# Patient Record
Sex: Female | Born: 1985
Health system: Southern US, Community
[De-identification: ages and names within clinical notes are randomized; demographics above are authoritative.]

## PROBLEM LIST (undated history)

## (undated) DIAGNOSIS — Z9889 Other specified postprocedural states: Secondary | ICD-10-CM

## (undated) DIAGNOSIS — R002 Palpitations: Secondary | ICD-10-CM

## (undated) DIAGNOSIS — Z8751 Personal history of pre-term labor: Secondary | ICD-10-CM

## (undated) DIAGNOSIS — Z87898 Personal history of other specified conditions: Secondary | ICD-10-CM

## (undated) DIAGNOSIS — F909 Attention-deficit hyperactivity disorder, unspecified type: Secondary | ICD-10-CM

## (undated) DIAGNOSIS — R112 Nausea with vomiting, unspecified: Secondary | ICD-10-CM

## (undated) HISTORY — PX: CERVICAL BIOPSY  W/ LOOP ELECTRODE EXCISION: SUR135

## (undated) HISTORY — DX: Palpitations: R00.2

## (undated) HISTORY — PX: MYOMECTOMY: SHX85

## (undated) HISTORY — PX: OTHER SURGICAL HISTORY: SHX169

## (undated) HISTORY — DX: Attention-deficit hyperactivity disorder, unspecified type: F90.9

## (undated) HISTORY — PX: TONSILLECTOMY AND ADENOIDECTOMY: SUR1326

---
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
## 2007-09-21 HISTORY — PX: BREAST ENHANCEMENT SURGERY: SHX7

## 2014-01-15 ENCOUNTER — Ambulatory Visit (INDEPENDENT_AMBULATORY_CARE_PROVIDER_SITE_OTHER): Payer: BC Managed Care – PPO | Admitting: Cardiology

## 2014-01-15 ENCOUNTER — Encounter: Payer: Self-pay | Admitting: *Deleted

## 2014-01-15 VITALS — BP 134/72 | HR 80 | Ht 65.0 in

## 2014-01-15 DIAGNOSIS — F909 Attention-deficit hyperactivity disorder, unspecified type: Secondary | ICD-10-CM | POA: Insufficient documentation

## 2014-01-15 DIAGNOSIS — F908 Attention-deficit hyperactivity disorder, other type: Secondary | ICD-10-CM

## 2014-01-15 DIAGNOSIS — I471 Supraventricular tachycardia: Secondary | ICD-10-CM

## 2014-01-15 DIAGNOSIS — R000 Tachycardia, unspecified: Secondary | ICD-10-CM | POA: Insufficient documentation

## 2014-01-15 DIAGNOSIS — I493 Ventricular premature depolarization: Secondary | ICD-10-CM

## 2014-01-15 NOTE — Patient Instructions (Signed)
Your physician recommends that you continue on your current medications as directed. Please refer to the Current Medication list given to you today.   Your physician has requested that you have an echocardiogram. Echocardiography is a painless test that uses sound waves to create images of your heart. It provides your doctor with information about the size and shape of your heart and how well your heart's chambers and valves are working. This procedure takes approximately one hour. There are no restrictions for this procedure.    Your physician has recommended that you wear a 24 hour holter monitor. Holter monitors are medical devices that record the heart's electrical activity. Doctors most often use these monitors to diagnose arrhythmias. Arrhythmias are problems with the speed or rhythm of the heartbeat. The monitor is a small, portable device. You can wear one while you do your normal daily activities. This is usually used to diagnose what is causing palpitations/syncope (passing out).  Your physician recommends that you schedule a follow-up appointment in: 2 months with Dr Meda Coffee

## 2014-01-15 NOTE — Progress Notes (Signed)
Patient ID: Heather Herrera, female   DOB: [birth year], 28 y.o.   MRN: [id number]     Patient Name: Heather Herrera Date of Encounter: 01/15/2014  Primary Care Provider:  No primary provider on file. Primary Cardiologist:  Dorothy Spark   Problem List   Past Medical History  Diagnosis Date  . ADHD (attention deficit hyperactivity disorder)   . Palpitations    No past surgical history on file.  Allergies  Allergies  Allergen Reactions  . Azithromycin Hives  . 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 The patient states that she actually never felt the palpitation and this was an incidental finding. Her only diagnosis is ADHD for which she was started on Adderall at the beginning of school. She denies any chest pain dyspnea on exertion. She currently doesn't exercise much but he used hold out more in the past and never felt limited by shortness of breath. She denies lower extremity edema or claudication. She has occasional lower extremity cramping. She is no family history of sudden cardiac death. Her maternal grandfather had myocardial infarction in his 51s and died of congestive heart failure in his 47s. She has never smoked and doesn't have any children. No history of syncope other when her blood was drawn.  Home Medications  Prior to Admission medications   Medication Sig Start Date End Date Taking? Authorizing Provider  amphetamine-dextroamphetamine (ADDERALL XR) 25 MG 24 hr capsule Take 25 mg by mouth every morning.   Yes Historical Provider, MD  amphetamine-dextroamphetamine (ADDERALL) 10 MG tablet Take 10 mg by mouth daily as needed.   Yes Historical Provider, MD  clindamycin-tretinoin Pershing Proud) gel Apply topically at bedtime as needed.   Yes Historical Provider, MD  Multiple Vitamins tablet Take 1  tablet by mouth daily.   Yes Historical Provider, MD  Norethindrone-Ethinyl Estradiol-Fe Biphas (LO LOESTRIN FE) 1 MG-10 MCG / 10 MCG tablet Take 1 tablet by mouth daily.   Yes Historical Provider, MD    Family History  Family History  Problem Relation Age of Onset  . Heart attack Neg Hx   . Sudden death Neg Hx     Social History  History   Social History  . Marital Status: Married    Spouse Name: N/A    Number of Children: N/A  . Years of Education: N/A   Occupational History  . Not on file.   Social History Main Topics  . Smoking status: Never Smoker   . Smokeless tobacco: Not on file  . Alcohol Use: No  . Drug Use: No  . Sexual Activity: Not on file   Other Topics Concern  . Not on file   Social History Narrative  . No narrative on file     Review of Systems, as per HPI, otherwise negative General:  No chills, fever, night sweats or weight changes.  Cardiovascular:  No chest pain, dyspnea on exertion, edema, orthopnea, palpitations, paroxysmal nocturnal dyspnea. Dermatological: No rash, lesions/masses Respiratory: No cough, dyspnea Urologic: No hematuria, dysuria Abdominal:   No nausea, vomiting, diarrhea, bright red blood per rectum, melena, or hematemesis Neurologic:  No visual changes, wkns, changes in mental status. All other systems reviewed and are otherwise negative except as noted above.  Physical Exam  Blood pressure 134/72, pulse 80, height 5\' 5"  (1.651 m).  General: Pleasant, NAD Psych: Normal affect. Neuro: Alert and oriented X 3. Moves all extremities spontaneously. HEENT: Normal  Neck: Supple without bruits or JVD. Lungs:  Resp regular and unlabored, CTA. Heart: RRR no s3, s4, or murmurs. Abdomen: Soft, non-tender, non-distended, BS + x 4.  Extremities: No clubbing, cyanosis or edema. DP/PT/Radials 2+ and equal bilaterally.  Labs:  No results found for this basename: CKTOTAL, CKMB, TROPONINI,  in the last 72 hours No results found for  this basename: WBC, HGB, HCT, MCV, PLT    No results found for this basename: DDIMER   No components found with this basename: POCBNP,  No results found for this basename: na, k, cl, co2, glucose, bun, creatinine, calcium, prot, albumin, ast, alt, alkphos, bilitot, gfrnonaa, gfraa   No results found for this basename: CHOL, HDL, LDLCALC, TRIG    Accessory Clinical Findings  Echocardiogram- none  ECG - sinus tachycardia, PR interval 110 ms, PVCs in a pattern of quadrigeminy.    Assessment & Plan  28 year old female  1. Persistent sinus tachycardia and frequent PVCs in a pattern of quadrigeminal mania. Posterior 24-hour Holter monitor to evaluate for heart rate variability during the day. We will also be able to see there are any other arrhythmia such as nonsustained VT's and burden of PVCs. Ordered echocardiogram to rule out any underlying structural heart disease. Patient had recent lab drawn at her primary care physician office and they were all normal including TSH. This is possibly secondary to symptomimetic - Adderall, the patient states that if necessary she will be willing to try to taper down the dose of Adderall. We will follow once we have the results of echocardiogram and Holter monitoring.  Follow-up in 1 months.  Dorothy Spark, MD, [facility] 01/15/2014, 1:08 PM

## 2014-01-29 ENCOUNTER — Encounter: Payer: Self-pay | Admitting: *Deleted

## 2014-01-29 ENCOUNTER — Other Ambulatory Visit (HOSPITAL_COMMUNITY): Payer: BC Managed Care – PPO

## 2014-01-29 NOTE — Progress Notes (Signed)
Patient ID: Heather Herrera, female   DOB: Jan 08, 1986, 28 y.o.   MRN: [id number] Patient did not show up for 01/29/2014 8:30 AM appointment to have a 24 hour holter monitor applied.

## 2014-01-30 ENCOUNTER — Encounter (HOSPITAL_COMMUNITY): Payer: Self-pay | Admitting: Cardiology

## 2014-02-05 ENCOUNTER — Other Ambulatory Visit (HOSPITAL_COMMUNITY): Payer: BC Managed Care – PPO

## 2014-02-27 ENCOUNTER — Ambulatory Visit (HOSPITAL_COMMUNITY): Payer: BC Managed Care – PPO | Attending: Cardiology | Admitting: Radiology

## 2014-02-27 ENCOUNTER — Encounter: Payer: Self-pay | Admitting: *Deleted

## 2014-02-27 ENCOUNTER — Encounter (INDEPENDENT_AMBULATORY_CARE_PROVIDER_SITE_OTHER): Payer: BC Managed Care – PPO

## 2014-02-27 DIAGNOSIS — I493 Ventricular premature depolarization: Secondary | ICD-10-CM

## 2014-02-27 NOTE — Progress Notes (Signed)
Echocardiogram performed.  

## 2014-02-27 NOTE — Progress Notes (Signed)
Patient ID: Heather Herrera, female   DOB: 05/31/1985, 28 y.o.   MRN: [id number] [employer] 24 hour holter monitor applied to patient.

## 2014-03-09 ENCOUNTER — Encounter: Payer: Self-pay | Admitting: *Deleted

## 2014-03-13 ENCOUNTER — Telehealth: Payer: Self-pay | Admitting: *Deleted

## 2014-03-13 NOTE — Telephone Encounter (Signed)
Notified the pt of infrequent PVCs and that per Dr Meda Coffee, she will discuss more with her at her next OV on 04/30/14.  Pt verbalized understanding and agrees with this plan.

## 2014-03-14 ENCOUNTER — Other Ambulatory Visit (HOSPITAL_COMMUNITY)
Admission: RE | Admit: 2014-03-14 | Discharge: 2014-03-14 | Disposition: A | Discharge status: Home / Self Care | Payer: BC Managed Care – PPO | Source: Ambulatory Visit | Attending: Obstetrics and Gynecology | Admitting: Obstetrics and Gynecology

## 2014-03-14 ENCOUNTER — Other Ambulatory Visit: Payer: Self-pay | Admitting: Obstetrics and Gynecology

## 2014-03-14 ENCOUNTER — Ambulatory Visit: Payer: BC Managed Care – PPO | Admitting: Cardiology

## 2014-03-14 DIAGNOSIS — Z01419 Encounter for gynecological examination (general) (routine) without abnormal findings: Secondary | ICD-10-CM | POA: Diagnosis present

## 2014-03-15 LAB — CYTOLOGY - PAP

## 2014-04-30 ENCOUNTER — Ambulatory Visit: Payer: BC Managed Care – PPO | Admitting: Cardiology

## 2014-05-22 ENCOUNTER — Encounter: Payer: Self-pay | Admitting: Cardiology

## 2014-05-22 ENCOUNTER — Ambulatory Visit (INDEPENDENT_AMBULATORY_CARE_PROVIDER_SITE_OTHER): Payer: BC Managed Care – PPO | Admitting: Cardiology

## 2014-05-22 VITALS — BP 120/82 | HR 93 | Ht 65.0 in | Wt 130.0 lb

## 2014-05-22 DIAGNOSIS — I493 Ventricular premature depolarization: Secondary | ICD-10-CM

## 2014-05-22 DIAGNOSIS — R252 Cramp and spasm: Secondary | ICD-10-CM

## 2014-05-22 NOTE — Patient Instructions (Signed)
Your physician recommends that you continue on your current medications as directed. Please refer to the Current Medication list given to you today.   Your physician wants you to follow-up in: ONE YEAR WITH DR NELSON You will receive a reminder letter in the mail two months in advance. If you don't receive a letter, please call our office to schedule the follow-up appointment.  

## 2014-05-22 NOTE — Progress Notes (Signed)
Patient ID: Heather Herrera, female   DOB: 1985/07/18, 29 y.o.   MRN: [id number]    Patient Name: Heather Herrera Date of Encounter: 05/22/2014  Primary Care Provider:  REDMON,NOELLE, PA-C Primary Cardiologist:  Dorothy Spark  Problem List   Past Medical History  Diagnosis Date  . ADHD (attention deficit hyperactivity disorder)   . Palpitations    Past Surgical History  Procedure Laterality Date  . No surgical hx     Allergies  Allergies  Allergen Reactions  . Azithromycin Hives  . 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 The patient states that she actually never felt the palpitation and this was an incidental finding. Her only diagnosis is ADHD for which she was started on Adderall at the beginning of school. She denies any chest pain dyspnea on exertion. She currently doesn't exercise much but he used hold out more in the past and never felt limited by shortness of breath. She denies lower extremity edema or claudication. She has occasional lower extremity cramping. She is no family history of sudden cardiac death. Her maternal grandfather had myocardial infarction in his 44s and died of congestive heart failure in his 36s. She has never smoked and doesn't have any children. No history of syncope other when her blood was drawn.  05/22/2014 - the patient is coming after 2 months, she denies any palpitations, presyncope or syncope, but her lower extremity cramps have resolved after initiation of magnesium. No other complains, she continues to take the same dose of Adderal.   Home Medications  Prior to Admission medications   Medication Sig Start Date End Date Taking? Authorizing Provider  amphetamine-dextroamphetamine (ADDERALL XR) 25 MG 24 hr capsule Take 25 mg by mouth every morning.   Yes Historical  Provider, MD  amphetamine-dextroamphetamine (ADDERALL) 10 MG tablet Take 10 mg by mouth daily as needed.   Yes Historical Provider, MD  clindamycin-tretinoin Pershing Proud) gel Apply topically at bedtime as needed.   Yes Historical Provider, MD  Multiple Vitamins tablet Take 1 tablet by mouth daily.   Yes Historical Provider, MD  Norethindrone-Ethinyl Estradiol-Fe Biphas (LO LOESTRIN FE) 1 MG-10 MCG / 10 MCG tablet Take 1 tablet by mouth daily.   Yes Historical Provider, MD   Family History  Family History  Problem Relation Age of Onset  . Heart attack Neg Hx   . Sudden death Neg Hx    Social History  History   Social History  . Marital Status: Married    Spouse Name: N/A  . Number of Children: N/A  . Years of Education: N/A   Occupational History  . Not on file.   Social History Main Topics  . Smoking status: Never Smoker   . Smokeless tobacco: Not on file  . Alcohol Use: No  . Drug Use: No  . Sexual Activity: Not on file   Other Topics Concern  . Not on file   Social History Narrative     Review of Systems, as per HPI, otherwise negative General:  No chills, fever, night sweats or weight changes.  Cardiovascular:  No chest pain, dyspnea on exertion, edema, orthopnea, palpitations, paroxysmal nocturnal dyspnea. Dermatological: No rash, lesions/masses Respiratory: No cough, dyspnea Urologic: No hematuria, dysuria Abdominal:   No nausea, vomiting, diarrhea, bright red blood per rectum, melena, or hematemesis Neurologic:  No visual changes, wkns, changes in mental status. All other systems reviewed and are otherwise negative except as noted above.  Physical Exam  There were no vitals taken for this visit.  General: Pleasant, NAD Psych: Normal affect. Neuro: Alert and oriented X 3. Moves all extremities spontaneously. HEENT: Normal  Neck: Supple without bruits or JVD. Lungs:  Resp regular and unlabored, CTA. Heart: RRR no s3, s4, or murmurs. Abdomen: Soft, non-tender,  non-distended, BS + x 4.  Extremities: No clubbing, cyanosis or edema. DP/PT/Radials 2+ and equal bilaterally.  Labs:  No results for input(s): CKTOTAL, CKMB, TROPONINI in the last 72 hours. No results found for: WBC  No results found for: DDIMER Invalid input(s): POCBNP No results found for: NA No results found for: CHOL  Accessory Clinical Findings  Echocardiogram- 03/19/2014 Left ventricle: Abnormal septal motion. The cavity size was normal. Systolic function was normal. The estimated ejection fraction was 55%. Wall motion was normal; there were no regional wall motion abnormalities. - Atrial septum: No defect or patent foramen ovale was identified  ECG - sinus tachycardia, PR interval 110 ms, PVCs in a pattern of quadrigeminy.    Assessment & Plan  29 year old female  1. Persistent sinus tachycardia and frequent PVCs in a pattern of quadrugeminal pattern. On 24 hour Holter only 260 in 24 hours, no other arrhythmia. Since the patient is asymptomatic no treatment is necessary. She is advised to cut down the dose of Adderal, avoid excessive caffeine drinking. Labs were all normal including TSH. Normal echocardiogram.  2. Leg cramping - resolved with magnesium sulfate  Follow-up in 1 year.  Dorothy Spark, MD, [facility] 05/22/2014, 8:45 AM

## 2014-09-14 ENCOUNTER — Other Ambulatory Visit (HOSPITAL_COMMUNITY)
Admission: RE | Admit: 2014-09-14 | Discharge: 2014-09-14 | Disposition: A | Discharge status: Home / Self Care | Payer: BC Managed Care – PPO | Source: Ambulatory Visit | Attending: Obstetrics and Gynecology | Admitting: Obstetrics and Gynecology

## 2014-09-14 DIAGNOSIS — Z01419 Encounter for gynecological examination (general) (routine) without abnormal findings: Secondary | ICD-10-CM | POA: Diagnosis present

## 2014-09-17 ENCOUNTER — Other Ambulatory Visit: Payer: Self-pay | Admitting: Obstetrics and Gynecology

## 2014-09-17 LAB — CYTOLOGY - PAP

## 2015-03-15 ENCOUNTER — Other Ambulatory Visit: Payer: Self-pay | Admitting: Obstetrics and Gynecology

## 2015-03-15 ENCOUNTER — Other Ambulatory Visit (HOSPITAL_COMMUNITY)
Admission: RE | Admit: 2015-03-15 | Discharge: 2015-03-15 | Disposition: A | Discharge status: Home / Self Care | Payer: BC Managed Care – PPO | Source: Ambulatory Visit | Attending: Obstetrics and Gynecology | Admitting: Obstetrics and Gynecology

## 2015-03-15 DIAGNOSIS — Z01419 Encounter for gynecological examination (general) (routine) without abnormal findings: Secondary | ICD-10-CM | POA: Insufficient documentation

## 2015-03-20 LAB — CYTOLOGY - PAP

## 2015-09-09 DIAGNOSIS — L811 Chloasma: Secondary | ICD-10-CM | POA: Diagnosis not present

## 2015-09-09 DIAGNOSIS — L738 Other specified follicular disorders: Secondary | ICD-10-CM | POA: Diagnosis not present

## 2015-09-09 DIAGNOSIS — L700 Acne vulgaris: Secondary | ICD-10-CM | POA: Diagnosis not present

## 2015-10-15 ENCOUNTER — Other Ambulatory Visit (HOSPITAL_COMMUNITY)
Admission: RE | Admit: 2015-10-15 | Discharge: 2015-10-15 | Disposition: A | Discharge status: Home / Self Care | Payer: BC Managed Care – PPO | Source: Ambulatory Visit | Attending: Obstetrics and Gynecology | Admitting: Obstetrics and Gynecology

## 2015-10-15 ENCOUNTER — Other Ambulatory Visit: Payer: Self-pay | Admitting: Obstetrics and Gynecology

## 2015-10-15 DIAGNOSIS — Z01419 Encounter for gynecological examination (general) (routine) without abnormal findings: Secondary | ICD-10-CM | POA: Diagnosis not present

## 2015-10-15 DIAGNOSIS — Z1151 Encounter for screening for human papillomavirus (HPV): Secondary | ICD-10-CM | POA: Insufficient documentation

## 2015-10-15 DIAGNOSIS — Z13220 Encounter for screening for lipoid disorders: Secondary | ICD-10-CM | POA: Diagnosis not present

## 2015-10-17 LAB — CYTOLOGY - PAP

## 2015-10-28 DIAGNOSIS — H01001 Unspecified blepharitis right upper eyelid: Secondary | ICD-10-CM | POA: Diagnosis not present

## 2015-10-28 DIAGNOSIS — H02055 Trichiasis without entropian left lower eyelid: Secondary | ICD-10-CM | POA: Diagnosis not present

## 2015-10-28 DIAGNOSIS — H18601 Keratoconus, unspecified, right eye: Secondary | ICD-10-CM | POA: Diagnosis not present

## 2015-10-28 DIAGNOSIS — H02052 Trichiasis without entropian right lower eyelid: Secondary | ICD-10-CM | POA: Diagnosis not present

## 2015-11-20 DIAGNOSIS — F900 Attention-deficit hyperactivity disorder, predominantly inattentive type: Secondary | ICD-10-CM | POA: Diagnosis not present

## 2015-11-20 DIAGNOSIS — Z23 Encounter for immunization: Secondary | ICD-10-CM | POA: Diagnosis not present

## 2016-03-24 DIAGNOSIS — J069 Acute upper respiratory infection, unspecified: Secondary | ICD-10-CM | POA: Diagnosis not present

## 2016-04-03 DIAGNOSIS — H1789 Other corneal scars and opacities: Secondary | ICD-10-CM | POA: Diagnosis not present

## 2016-04-03 DIAGNOSIS — H18601 Keratoconus, unspecified, right eye: Secondary | ICD-10-CM | POA: Diagnosis not present

## 2016-06-03 DIAGNOSIS — H16401 Unspecified corneal neovascularization, right eye: Secondary | ICD-10-CM | POA: Diagnosis not present

## 2016-06-03 DIAGNOSIS — H18459 Nodular corneal degeneration, unspecified eye: Secondary | ICD-10-CM | POA: Diagnosis not present

## 2016-10-06 DIAGNOSIS — L811 Chloasma: Secondary | ICD-10-CM | POA: Diagnosis not present

## 2016-12-21 DIAGNOSIS — H16401 Unspecified corneal neovascularization, right eye: Secondary | ICD-10-CM | POA: Diagnosis not present

## 2016-12-21 DIAGNOSIS — H18459 Nodular corneal degeneration, unspecified eye: Secondary | ICD-10-CM | POA: Diagnosis not present

## 2016-12-23 ENCOUNTER — Other Ambulatory Visit (HOSPITAL_COMMUNITY)
Admission: RE | Admit: 2016-12-23 | Discharge: 2016-12-23 | Disposition: A | Discharge status: Home / Self Care | Payer: BLUE CROSS/BLUE SHIELD | Source: Ambulatory Visit | Attending: Obstetrics and Gynecology | Admitting: Obstetrics and Gynecology

## 2016-12-23 ENCOUNTER — Other Ambulatory Visit: Payer: Self-pay | Admitting: Obstetrics and Gynecology

## 2016-12-23 DIAGNOSIS — Z3041 Encounter for surveillance of contraceptive pills: Secondary | ICD-10-CM | POA: Diagnosis not present

## 2016-12-23 DIAGNOSIS — Z01419 Encounter for gynecological examination (general) (routine) without abnormal findings: Secondary | ICD-10-CM | POA: Diagnosis not present

## 2016-12-25 LAB — CYTOLOGY - PAP: Diagnosis: NEGATIVE

## 2017-02-05 DIAGNOSIS — Z13220 Encounter for screening for lipoid disorders: Secondary | ICD-10-CM | POA: Diagnosis not present

## 2017-02-05 DIAGNOSIS — Z0000 Encounter for general adult medical examination without abnormal findings: Secondary | ICD-10-CM | POA: Diagnosis not present

## 2017-02-05 DIAGNOSIS — Z131 Encounter for screening for diabetes mellitus: Secondary | ICD-10-CM | POA: Diagnosis not present

## 2017-02-05 DIAGNOSIS — F900 Attention-deficit hyperactivity disorder, predominantly inattentive type: Secondary | ICD-10-CM | POA: Diagnosis not present

## 2017-02-10 DIAGNOSIS — E7800 Pure hypercholesterolemia, unspecified: Secondary | ICD-10-CM | POA: Diagnosis not present

## 2017-02-16 DIAGNOSIS — E7800 Pure hypercholesterolemia, unspecified: Secondary | ICD-10-CM | POA: Diagnosis not present

## 2017-04-19 DIAGNOSIS — E7800 Pure hypercholesterolemia, unspecified: Secondary | ICD-10-CM | POA: Diagnosis not present

## 2017-05-25 DIAGNOSIS — H1789 Other corneal scars and opacities: Secondary | ICD-10-CM | POA: Diagnosis not present

## 2017-07-14 DIAGNOSIS — L811 Chloasma: Secondary | ICD-10-CM | POA: Diagnosis not present

## 2017-08-23 DIAGNOSIS — H18453 Nodular corneal degeneration, bilateral: Secondary | ICD-10-CM | POA: Diagnosis not present

## 2017-08-23 DIAGNOSIS — H16401 Unspecified corneal neovascularization, right eye: Secondary | ICD-10-CM | POA: Diagnosis not present

## 2017-08-23 DIAGNOSIS — H18451 Nodular corneal degeneration, right eye: Secondary | ICD-10-CM | POA: Diagnosis not present

## 2017-09-21 DIAGNOSIS — R05 Cough: Secondary | ICD-10-CM | POA: Diagnosis not present

## 2017-09-21 DIAGNOSIS — F900 Attention-deficit hyperactivity disorder, predominantly inattentive type: Secondary | ICD-10-CM | POA: Diagnosis not present

## 2017-12-27 ENCOUNTER — Other Ambulatory Visit: Payer: Self-pay | Admitting: Obstetrics and Gynecology

## 2017-12-27 ENCOUNTER — Other Ambulatory Visit (HOSPITAL_COMMUNITY)
Admission: RE | Admit: 2017-12-27 | Discharge: 2017-12-27 | Disposition: A | Discharge status: Home / Self Care | Payer: BLUE CROSS/BLUE SHIELD | Source: Ambulatory Visit | Attending: Obstetrics and Gynecology | Admitting: Obstetrics and Gynecology

## 2017-12-27 DIAGNOSIS — Z01419 Encounter for gynecological examination (general) (routine) without abnormal findings: Secondary | ICD-10-CM | POA: Insufficient documentation

## 2017-12-27 DIAGNOSIS — Z23 Encounter for immunization: Secondary | ICD-10-CM | POA: Diagnosis not present

## 2017-12-28 LAB — CYTOLOGY - PAP: Diagnosis: NEGATIVE

## 2018-02-21 DIAGNOSIS — Z48810 Encounter for surgical aftercare following surgery on the sense organs: Secondary | ICD-10-CM | POA: Diagnosis not present

## 2018-02-21 DIAGNOSIS — H18459 Nodular corneal degeneration, unspecified eye: Secondary | ICD-10-CM | POA: Diagnosis not present

## 2018-02-21 DIAGNOSIS — H18452 Nodular corneal degeneration, left eye: Secondary | ICD-10-CM | POA: Diagnosis not present

## 2018-04-13 DIAGNOSIS — E7800 Pure hypercholesterolemia, unspecified: Secondary | ICD-10-CM | POA: Diagnosis not present

## 2018-04-13 DIAGNOSIS — F900 Attention-deficit hyperactivity disorder, predominantly inattentive type: Secondary | ICD-10-CM | POA: Diagnosis not present

## 2018-04-13 DIAGNOSIS — Z0000 Encounter for general adult medical examination without abnormal findings: Secondary | ICD-10-CM | POA: Diagnosis not present

## 2018-09-20 DIAGNOSIS — N911 Secondary amenorrhea: Secondary | ICD-10-CM | POA: Diagnosis not present

## 2018-09-20 DIAGNOSIS — O200 Threatened abortion: Secondary | ICD-10-CM | POA: Diagnosis not present

## 2018-09-27 DIAGNOSIS — O021 Missed abortion: Secondary | ICD-10-CM | POA: Diagnosis not present

## 2018-10-05 DIAGNOSIS — H5213 Myopia, bilateral: Secondary | ICD-10-CM | POA: Diagnosis not present

## 2018-10-05 DIAGNOSIS — Z9889 Other specified postprocedural states: Secondary | ICD-10-CM | POA: Diagnosis not present

## 2018-10-25 DIAGNOSIS — N912 Amenorrhea, unspecified: Secondary | ICD-10-CM | POA: Diagnosis not present

## 2018-10-27 DIAGNOSIS — N912 Amenorrhea, unspecified: Secondary | ICD-10-CM | POA: Diagnosis not present

## 2018-11-01 DIAGNOSIS — N912 Amenorrhea, unspecified: Secondary | ICD-10-CM | POA: Diagnosis not present

## 2018-11-08 DIAGNOSIS — D259 Leiomyoma of uterus, unspecified: Secondary | ICD-10-CM | POA: Diagnosis not present

## 2018-11-08 DIAGNOSIS — N911 Secondary amenorrhea: Secondary | ICD-10-CM | POA: Diagnosis not present

## 2018-11-08 DIAGNOSIS — R87619 Unspecified abnormal cytological findings in specimens from cervix uteri: Secondary | ICD-10-CM | POA: Diagnosis not present

## 2018-11-08 DIAGNOSIS — O26859 Spotting complicating pregnancy, unspecified trimester: Secondary | ICD-10-CM | POA: Diagnosis not present

## 2018-11-17 DIAGNOSIS — Z3685 Encounter for antenatal screening for Streptococcus B: Secondary | ICD-10-CM | POA: Diagnosis not present

## 2018-11-17 DIAGNOSIS — Z3401 Encounter for supervision of normal first pregnancy, first trimester: Secondary | ICD-10-CM | POA: Diagnosis not present

## 2018-11-17 DIAGNOSIS — Z31430 Encounter of female for testing for genetic disease carrier status for procreative management: Secondary | ICD-10-CM | POA: Diagnosis not present

## 2018-11-17 DIAGNOSIS — Z3481 Encounter for supervision of other normal pregnancy, first trimester: Secondary | ICD-10-CM | POA: Diagnosis not present

## 2018-11-25 ENCOUNTER — Inpatient Hospital Stay (HOSPITAL_COMMUNITY)
Admission: AD | Admit: 2018-11-25 | Discharge: 2018-11-26 | Disposition: A | Discharge status: Home / Self Care | Payer: BC Managed Care – PPO | Attending: Obstetrics and Gynecology | Admitting: Obstetrics and Gynecology

## 2018-11-25 ENCOUNTER — Encounter (HOSPITAL_COMMUNITY): Payer: Self-pay

## 2018-11-25 ENCOUNTER — Other Ambulatory Visit: Payer: Self-pay

## 2018-11-25 ENCOUNTER — Inpatient Hospital Stay (HOSPITAL_COMMUNITY): Payer: BC Managed Care – PPO

## 2018-11-25 DIAGNOSIS — O208 Other hemorrhage in early pregnancy: Secondary | ICD-10-CM | POA: Diagnosis not present

## 2018-11-25 DIAGNOSIS — O468X1 Other antepartum hemorrhage, first trimester: Secondary | ICD-10-CM | POA: Diagnosis not present

## 2018-11-25 DIAGNOSIS — O360910 Maternal care for other rhesus isoimmunization, first trimester, not applicable or unspecified: Secondary | ICD-10-CM | POA: Diagnosis not present

## 2018-11-25 DIAGNOSIS — Z6740 Type O blood, Rh positive: Secondary | ICD-10-CM

## 2018-11-25 DIAGNOSIS — O209 Hemorrhage in early pregnancy, unspecified: Secondary | ICD-10-CM | POA: Diagnosis not present

## 2018-11-25 DIAGNOSIS — Z880 Allergy status to penicillin: Secondary | ICD-10-CM | POA: Diagnosis not present

## 2018-11-25 DIAGNOSIS — Z3A09 9 weeks gestation of pregnancy: Secondary | ICD-10-CM

## 2018-11-25 DIAGNOSIS — D259 Leiomyoma of uterus, unspecified: Secondary | ICD-10-CM | POA: Diagnosis not present

## 2018-11-25 DIAGNOSIS — O418X10 Other specified disorders of amniotic fluid and membranes, first trimester, not applicable or unspecified: Secondary | ICD-10-CM

## 2018-11-25 LAB — CBC
HCT: 37.3 % (ref 36.0–46.0)
Hemoglobin: 12.6 g/dL (ref 12.0–15.0)
MCH: 30.9 pg (ref 26.0–34.0)
MCHC: 33.8 g/dL (ref 30.0–36.0)
MCV: 91.4 fL (ref 80.0–100.0)
Platelets: 257 10*3/uL (ref 150–400)
RBC: 4.08 MIL/uL (ref 3.87–5.11)
RDW: 12.2 % (ref 11.5–15.5)
WBC: 10.6 10*3/uL — ABNORMAL HIGH (ref 4.0–10.5)
nRBC: 0 % (ref 0.0–0.2)

## 2018-11-25 LAB — ABO/RH: ABO/RH(D): O POS

## 2018-11-25 NOTE — MAU Provider Note (Signed)
History     CSN: [id number]  Arrival date and time: 11/25/18 2130   First Provider Initiated Contact with Patient 11/25/18 2238      Chief Complaint  Patient presents with  . Vaginal Bleeding   Heather Herrera is a 33 y.o. G1P0 at [redacted]w[redacted]d who presents today with vaginal bleeding. She states that she had an [place] at her OB office that found a [facility]. Next visit with OB is 11/29/2018.   Vaginal Bleeding The patient's primary symptoms include vaginal bleeding. The patient's pertinent negatives include no pelvic pain or vaginal discharge. This is a new problem. The current episode started today. The problem occurs constantly. The problem has been gradually improving. The patient is experiencing no pain. She is pregnant. Pertinent negatives include no chills, dysuria, fever, frequency, nausea, urgency or vomiting. The vaginal discharge was bloody. The vaginal bleeding is typical of menses. She has been passing clots (small, unsure on size. ). She has not been passing tissue. Nothing aggravates the symptoms. She has tried nothing for the symptoms. Sexual activity: denies intercourse in the last 24 hours.     OB History    Gravida  2   Para      Term      Preterm      AB  1   Living        SAB  1   TAB      Ectopic      Multiple      Live Births              Past Medical History:  Diagnosis Date  . ADHD (attention deficit hyperactivity disorder)   . Palpitations     Past Surgical History:  Procedure Laterality Date  . BREAST ENHANCEMENT SURGERY    . CERVICAL BIOPSY  W/ LOOP ELECTRODE EXCISION    . no surgical hx    . TONSILLECTOMY    . WISDOM TOOTH EXTRACTION      Family History  Problem Relation Age of Onset  . Heart attack Neg Hx   . Sudden death Neg Hx     Social History   Tobacco Use  . Smoking status: Never Smoker  . Smokeless tobacco: Never Used  Substance Use Topics  . Alcohol use: No  . Drug use: No    Allergies:  Allergies  Allergen Reactions  .  Azithromycin Hives  . Penicillins Hives    Medications Prior to Admission  Medication Sig Dispense Refill Last Dose  . amphetamine-dextroamphetamine (ADDERALL XR) 25 MG 24 hr capsule Take 25 mg by mouth every morning.     Marland Kitchen amphetamine-dextroamphetamine (ADDERALL) 10 MG tablet Take 10 mg by mouth daily as needed.     . clindamycin-tretinoin (ZIANA) gel Apply topically at bedtime as needed.     . Multiple Vitamins tablet Take 1 tablet by mouth daily.     . Norethindrone-Ethinyl Estradiol-Fe Biphas (LO LOESTRIN FE) 1 MG-10 MCG / 10 MCG tablet Take 1 tablet by mouth daily.       Review of Systems  Constitutional: Negative for chills and fever.  Gastrointestinal: Negative for nausea and vomiting.  Genitourinary: Positive for vaginal bleeding. Negative for dysuria, frequency, pelvic pain, urgency and vaginal discharge.   Physical Exam   Blood pressure 123/74, pulse 71, temperature 98 F (36.7 C), resp. rate 17, weight 63.6 kg.  Physical Exam  Nursing note and vitals reviewed. Constitutional: She is oriented to person, place, and time. She appears well-developed and well-nourished. No  distress.  HENT:  Head: Normocephalic.  Cardiovascular: Normal rate.  Respiratory: Effort normal.  GI: Soft. There is no abdominal tenderness. There is no rebound.  Neurological: She is alert and oriented to person, place, and time.  Skin: Skin is warm and dry.  Psychiatric: She has a normal mood and affect.     Results for orders placed or performed during the hospital encounter of 11/25/18 (from the past 24 hour(s))  CBC     Status: Abnormal   Collection Time: 11/25/18 10:52 PM  Result Value Ref Range   WBC 10.6 (H) 4.0 - 10.5 K/uL   RBC 4.08 3.87 - 5.11 MIL/uL   Hemoglobin 12.6 12.0 - 15.0 g/dL   HCT 37.3 36.0 - 46.0 %   MCV 91.4 80.0 - 100.0 fL   MCH 30.9 26.0 - 34.0 pg   MCHC 33.8 30.0 - 36.0 g/dL   RDW 12.2 11.5 - 15.5 %   Platelets 257 150 - 400 K/uL   nRBC 0.0 0.0 - 0.2 %  ABO/Rh      Status: None   Collection Time: 11/25/18 10:52 PM  Result Value Ref Range   ABO/RH(D)      O POS Performed at [facility] [address], [place], [place] [address]    US Ob Comp Less 14 Wks  Result Date: 11/26/2018 CLINICAL DATA:  Bleeding EXAM: OBSTETRIC <14 WK ULTRASOUND TECHNIQUE: Transabdominal ultrasound was performed for evaluation of the gestation as well as the maternal uterus and adnexal regions. COMPARISON:  None. FINDINGS: Intrauterine gestational sac: Single Yolk sac:  Visualized Embryo:  Visualized Cardiac Activity: Visualized Heart Rate: 166 bpm MSD:    mm    w     d CRL:   23.3 mm   9 w 0 d                  [place] EDC: 06/30/2019 Subchorionic hemorrhage:  Moderate to large subchorionic hemorrhage. Maternal uterus/adnexae: Multiple fibroids, the largest 8.7 cm. No adnexal mass. No free fluid. IMPRESSION: Nine week intrauterine pregnancy. Fetal heart rate 166 beats per minute. Moderate to large subchorionic hemorrhage. Multiple fibroids, the largest 8.7 cm. Electronically Signed   By: Rolm Baptise M.D.   On: 11/26/2018 00:13     MAU Course  Procedures  MDM   Assessment and Plan   1. Subchorionic hemorrhage of placenta in first trimester, single or unspecified fetus   2. Vaginal bleeding in pregnancy, first trimester   3. [redacted] weeks gestation of pregnancy   4. Type O blood, Rh positive    DC home Comfort measures reviewed  1st Trimester precautions  Bleeding precautions RX: none  Return to MAU as needed FU with OB as planned  Follow-up Information    [place] Follow up.   Contact information: [place] [address] [phone]          Rayn Enderson DNP, CNM  11/26/18  12:23 AM

## 2018-11-25 NOTE — MAU Note (Signed)
States she had a large gush of fluid at 2100-bleeding through her clothes.  No abdominal pain.  States she has a known subchorionic hematoma as seen by U/S at 7weeks.

## 2018-11-26 ENCOUNTER — Encounter (HOSPITAL_COMMUNITY): Payer: Self-pay | Admitting: Advanced Practice Midwife

## 2018-11-26 NOTE — Discharge Instructions (Signed)

## 2018-11-29 DIAGNOSIS — Z3401 Encounter for supervision of normal first pregnancy, first trimester: Secondary | ICD-10-CM | POA: Diagnosis not present

## 2018-11-29 DIAGNOSIS — Z331 Pregnant state, incidental: Secondary | ICD-10-CM | POA: Diagnosis not present

## 2018-11-29 DIAGNOSIS — Z113 Encounter for screening for infections with a predominantly sexual mode of transmission: Secondary | ICD-10-CM | POA: Diagnosis not present

## 2018-11-29 DIAGNOSIS — Z124 Encounter for screening for malignant neoplasm of cervix: Secondary | ICD-10-CM | POA: Diagnosis not present

## 2018-11-30 DIAGNOSIS — Z124 Encounter for screening for malignant neoplasm of cervix: Secondary | ICD-10-CM | POA: Diagnosis not present

## 2018-11-30 DIAGNOSIS — Z331 Pregnant state, incidental: Secondary | ICD-10-CM | POA: Diagnosis not present

## 2018-12-21 DIAGNOSIS — Z3A13 13 weeks gestation of pregnancy: Secondary | ICD-10-CM | POA: Diagnosis not present

## 2018-12-21 DIAGNOSIS — Z3682 Encounter for antenatal screening for nuchal translucency: Secondary | ICD-10-CM | POA: Diagnosis not present

## 2018-12-28 DIAGNOSIS — Z23 Encounter for immunization: Secondary | ICD-10-CM | POA: Diagnosis not present

## 2019-01-27 DIAGNOSIS — Z361 Encounter for antenatal screening for raised alphafetoprotein level: Secondary | ICD-10-CM | POA: Diagnosis not present

## 2019-01-27 DIAGNOSIS — Z3A18 18 weeks gestation of pregnancy: Secondary | ICD-10-CM | POA: Diagnosis not present

## 2019-01-27 DIAGNOSIS — Z363 Encounter for antenatal screening for malformations: Secondary | ICD-10-CM | POA: Diagnosis not present

## 2019-02-20 ENCOUNTER — Other Ambulatory Visit: Payer: Self-pay

## 2019-02-20 ENCOUNTER — Encounter (HOSPITAL_COMMUNITY): Payer: Self-pay

## 2019-02-20 ENCOUNTER — Inpatient Hospital Stay (HOSPITAL_BASED_OUTPATIENT_CLINIC_OR_DEPARTMENT_OTHER): Payer: BC Managed Care – PPO

## 2019-02-20 ENCOUNTER — Inpatient Hospital Stay (HOSPITAL_COMMUNITY): Payer: BC Managed Care – PPO

## 2019-02-20 ENCOUNTER — Inpatient Hospital Stay (HOSPITAL_COMMUNITY)
Admission: AD | Admit: 2019-02-20 | Discharge: 2019-02-20 | Disposition: A | Discharge status: Home / Self Care | Payer: BC Managed Care – PPO | Attending: Obstetrics and Gynecology | Admitting: Obstetrics and Gynecology

## 2019-02-20 DIAGNOSIS — D259 Leiomyoma of uterus, unspecified: Secondary | ICD-10-CM

## 2019-02-20 DIAGNOSIS — Z3A22 22 weeks gestation of pregnancy: Secondary | ICD-10-CM

## 2019-02-20 DIAGNOSIS — O99891 Other specified diseases and conditions complicating pregnancy: Secondary | ICD-10-CM

## 2019-02-20 DIAGNOSIS — R109 Unspecified abdominal pain: Secondary | ICD-10-CM

## 2019-02-20 DIAGNOSIS — O2342 Unspecified infection of urinary tract in pregnancy, second trimester: Secondary | ICD-10-CM | POA: Insufficient documentation

## 2019-02-20 DIAGNOSIS — Z880 Allergy status to penicillin: Secondary | ICD-10-CM | POA: Insufficient documentation

## 2019-02-20 DIAGNOSIS — Z881 Allergy status to other antibiotic agents status: Secondary | ICD-10-CM | POA: Diagnosis not present

## 2019-02-20 DIAGNOSIS — O26892 Other specified pregnancy related conditions, second trimester: Secondary | ICD-10-CM | POA: Diagnosis not present

## 2019-02-20 DIAGNOSIS — O3412 Maternal care for benign tumor of corpus uteri, second trimester: Secondary | ICD-10-CM | POA: Diagnosis not present

## 2019-02-20 DIAGNOSIS — O26899 Other specified pregnancy related conditions, unspecified trimester: Secondary | ICD-10-CM

## 2019-02-20 LAB — URINALYSIS, ROUTINE W REFLEX MICROSCOPIC
Bilirubin Urine: NEGATIVE
Glucose, UA: NEGATIVE mg/dL
Hgb urine dipstick: NEGATIVE
Ketones, ur: NEGATIVE mg/dL
Nitrite: NEGATIVE
Protein, ur: NEGATIVE mg/dL
Specific Gravity, Urine: 1.015 (ref 1.005–1.030)
pH: 7 (ref 5.0–8.0)

## 2019-02-20 LAB — WET PREP, GENITAL
Clue Cells Wet Prep HPF POC: NONE SEEN
Sperm: NONE SEEN
Trich, Wet Prep: NONE SEEN
Yeast Wet Prep HPF POC: NONE SEEN

## 2019-02-20 MED ORDER — NITROFURANTOIN MONOHYD MACRO 100 MG PO CAPS: 100.0000 mg | ORAL_CAPSULE | Freq: Two times a day (BID) | ORAL | 0 refills | Status: DC

## 2019-02-20 NOTE — MAU Note (Signed)
Patient presents to MAU c/o dull, achy, lower constant abdominal cramps  for the past 2 days. Patient reports calling OB office and was told to come to MAU. Patient denies vaginal bleeding or LOF.

## 2019-02-20 NOTE — MAU Provider Note (Addendum)
History     CSN: [id number]  Arrival date and time: 02/20/19 [id number]   First Provider Initiated Contact with Patient 02/20/19 [phone]      Chief Complaint  Patient presents with  . Abdominal Pain   33 y.o. G2P0010 @22 .0 wks presenting with low abdominal cramping. Sx started last night. She was unable to sleep. Rates pain 4/10. She took Tylenol but didn't help. Denies urinary sx except ?increased frequency over last 24 hrs. Denies VB, LOF, or vaginal discharge. +FM. No GI sx.    OB History    Gravida  2   Para      Term      Preterm      AB  1   Living        SAB  1   TAB      Ectopic      Multiple      Live Births              Past Medical History:  Diagnosis Date  . ADHD (attention deficit hyperactivity disorder)   . Palpitations     Past Surgical History:  Procedure Laterality Date  . BREAST ENHANCEMENT SURGERY    . CERVICAL BIOPSY  W/ LOOP ELECTRODE EXCISION    . no surgical hx    . TONSILLECTOMY    . WISDOM TOOTH EXTRACTION      Family History  Problem Relation Age of Onset  . Heart attack Neg Hx   . Sudden death Neg Hx     Social History   Tobacco Use  . Smoking status: Never Smoker  . Smokeless tobacco: Never Used  Substance Use Topics  . Alcohol use: No  . Drug use: No    Allergies:  Allergies  Allergen Reactions  . Azithromycin Hives  . Penicillins Hives    Medications Prior to Admission  Medication Sig Dispense Refill Last Dose  . Prenatal Vit-Fe Fumarate-FA (MULTIVITAMIN-PRENATAL) 27-0.8 MG TABS tablet Take 1 tablet by mouth daily at 12 noon.   02/20/2019 at Unknown time  . clindamycin-tretinoin (ZIANA) gel Apply topically at bedtime as needed.     . Multiple Vitamins tablet Take 1 tablet by mouth daily.       Review of Systems  Constitutional: Negative for chills and fever.  Gastrointestinal: Positive for abdominal pain. Negative for constipation, diarrhea, nausea and vomiting.  Genitourinary: Positive for frequency.  Negative for dysuria, hematuria, urgency, vaginal bleeding and vaginal discharge.  Musculoskeletal: Negative for back pain.   Physical Exam   Blood pressure 126/75, pulse 95, temperature 98.3 F (36.8 C), resp. rate 17.  Physical Exam  Nursing note and vitals reviewed. Constitutional: She is oriented to person, place, and time. She appears well-developed and well-nourished. No distress.  HENT:  Head: Normocephalic and atraumatic.  Neck: Normal range of motion.  Cardiovascular: Normal rate.  Respiratory: Effort normal. No respiratory distress.  GI: Soft. She exhibits no distension. There is no abdominal tenderness.  gravid  Genitourinary:    Genitourinary Comments: SVE: closed/50/high   Musculoskeletal: Normal range of motion.  Neurological: She is alert and oriented to person, place, and time.  Skin: Skin is warm and dry.  Psychiatric: She has a normal mood and affect.  FHT 144  No results found for this or any previous visit (from the past 24 hour(s)).  MAU Course  Procedures  MDM No prenatal record on file, pt reports uncomplicated except uterine fibroids.  [place] and labs pending Transfer of care given Stann Mainland,  CNM Julianne Handler, CNM  02/20/2019 8:03 AM   Care taken over @ [facility] reviewed, [place] pending   Results for orders placed or performed during the hospital encounter of 02/20/19 (from the past 24 hour(s))  Wet prep, genital     Status: Abnormal   Collection Time: 02/20/19  7:15 AM   Specimen: Cervix  Result Value Ref Range   Yeast Wet Prep HPF POC NONE SEEN NONE SEEN   Trich, Wet Prep NONE SEEN NONE SEEN   Clue Cells Wet Prep HPF POC NONE SEEN NONE SEEN   WBC, Wet Prep HPF POC MANY (A) NONE SEEN   Sperm NONE SEEN   Urinalysis, Routine w reflex microscopic     Status: Abnormal   Collection Time: 02/20/19  7:17 AM  Result Value Ref Range   Color, Urine YELLOW YELLOW   APPearance CLEAR CLEAR   Specific Gravity, Urine 1.015 1.005 - 1.030   pH 7.0 5.0 - 8.0    Glucose, UA NEGATIVE NEGATIVE mg/dL   Hgb urine dipstick NEGATIVE NEGATIVE   Bilirubin Urine NEGATIVE NEGATIVE   Ketones, ur NEGATIVE NEGATIVE mg/dL   Protein, ur NEGATIVE NEGATIVE mg/dL   Nitrite NEGATIVE NEGATIVE   Leukocytes,Ua LARGE (A) NEGATIVE   RBC / HPF 0-5 0 - 5 RBC/hpf   WBC, UA 6-10 0 - 5 WBC/hpf   Bacteria, UA RARE (A) NONE SEEN   Squamous Epithelial / LPF 0-5 0 - 5   Mucus PRESENT    Urinalysis suspicious for UTI- urine culture ordered  US report reviewed: CL 3.15 (WNL), anterior placenta, FHR 152, AFI WNL - no abnormalities noted on [place] other than known uterine fibroid   Educated and discussed results of [place] and labs with patient. Discussed suspicion of UTI based on UA- will treat with Macrobid while urine culture pending. Discussed with patient will call her if urine culture positive or change of antibiotics needed- patient verbalizes understanding.   Discussed reasons to return to MAU. Follow up as scheduled in the office. Return to MAU as needed. Pt stable at time of discharge. Rx for Macrobid sent to pharmacy of choice.   Assessment and Plan   1. Urinary tract infection affecting care of mother in second trimester, antepartum   2. Abdominal cramping affecting pregnancy    Discharge home Follow up as scheduled in the office for prenatal care Return to MAU as needed for reasons discussed and/or emergencies  Rx for [place] on 02/27/2019.   Why: Follow up as scheduled for prenatal care and return to MAU as needed  Contact information: [place] [id number] [phone]          Allergies as of 02/20/2019      Reactions   Azithromycin Hives   Penicillins Hives      Medication List    TAKE these medications   clindamycin-tretinoin gel Commonly known as: ZIANA Apply topically at bedtime as needed.   Multiple Vitamins tablet Take 1 tablet by mouth daily.    multivitamin-prenatal 27-0.8 MG Tabs tablet Take 1 tablet by mouth daily at 12 noon.   nitrofurantoin (macrocrystal-monohydrate) 100 MG capsule Commonly known as: MACROBID Take 1 capsule (100 mg total) by mouth 2 (two) times daily.      Lajean Manes, CNM 02/20/19, 9:28 AM

## 2019-02-21 LAB — GC/CHLAMYDIA PROBE AMP (~~LOC~~) NOT AT ARMC
Chlamydia: NEGATIVE
Comment: NEGATIVE
Comment: NORMAL
Neisseria Gonorrhea: NEGATIVE

## 2019-02-21 LAB — CULTURE, OB URINE: Culture: NO GROWTH

## 2019-02-22 ENCOUNTER — Encounter (HOSPITAL_COMMUNITY)
Admission: AD | Disposition: A | Discharge status: Home / Self Care | Payer: Self-pay | Source: Home / Self Care | Attending: Obstetrics and Gynecology

## 2019-02-22 ENCOUNTER — Inpatient Hospital Stay (HOSPITAL_COMMUNITY): Payer: BC Managed Care – PPO | Admitting: Certified Registered Nurse Anesthetist

## 2019-02-22 ENCOUNTER — Other Ambulatory Visit: Payer: Self-pay

## 2019-02-22 ENCOUNTER — Inpatient Hospital Stay (HOSPITAL_COMMUNITY)
Admission: AD | Admit: 2019-02-22 | Discharge: 2019-02-22 | DRG: 798 | Disposition: A | Discharge status: Home / Self Care | Payer: BC Managed Care – PPO | Attending: Obstetrics and Gynecology | Admitting: Obstetrics and Gynecology

## 2019-02-22 ENCOUNTER — Encounter (HOSPITAL_COMMUNITY): Payer: Self-pay

## 2019-02-22 DIAGNOSIS — R1030 Lower abdominal pain, unspecified: Secondary | ICD-10-CM | POA: Diagnosis not present

## 2019-02-22 DIAGNOSIS — R300 Dysuria: Secondary | ICD-10-CM

## 2019-02-22 DIAGNOSIS — Z3A22 22 weeks gestation of pregnancy: Secondary | ICD-10-CM

## 2019-02-22 DIAGNOSIS — O26892 Other specified pregnancy related conditions, second trimester: Secondary | ICD-10-CM

## 2019-02-22 DIAGNOSIS — Z20828 Contact with and (suspected) exposure to other viral communicable diseases: Secondary | ICD-10-CM | POA: Diagnosis not present

## 2019-02-22 DIAGNOSIS — O3432 Maternal care for cervical incompetence, second trimester: Secondary | ICD-10-CM | POA: Diagnosis not present

## 2019-02-22 DIAGNOSIS — O364XX0 Maternal care for intrauterine death, not applicable or unspecified: Secondary | ICD-10-CM | POA: Diagnosis not present

## 2019-02-22 HISTORY — PX: DILATION AND CURETTAGE OF UTERUS: SHX78

## 2019-02-22 LAB — CBC
HCT: 36.3 % (ref 36.0–46.0)
Hemoglobin: 12.3 g/dL (ref 12.0–15.0)
MCH: 30.6 pg (ref 26.0–34.0)
MCHC: 33.9 g/dL (ref 30.0–36.0)
MCV: 90.3 fL (ref 80.0–100.0)
Platelets: 228 10*3/uL (ref 150–400)
RBC: 4.02 MIL/uL (ref 3.87–5.11)
RDW: 12.3 % (ref 11.5–15.5)
WBC: 16 10*3/uL — ABNORMAL HIGH (ref 4.0–10.5)
nRBC: 0 % (ref 0.0–0.2)

## 2019-02-22 LAB — SARS CORONAVIRUS 2 (TAT 6-24 HRS): SARS Coronavirus 2: NEGATIVE

## 2019-02-22 LAB — TYPE AND SCREEN
ABO/RH(D): O POS
Antibody Screen: NEGATIVE

## 2019-02-22 SURGERY — DILATION AND CURETTAGE
Anesthesia: General | Site: Vagina | Wound class: Clean Contaminated

## 2019-02-22 MED ORDER — OXYCODONE HCL 5 MG PO TABS: 5.0000 mg | ORAL_TABLET | Freq: Once | ORAL | Status: DC | PRN

## 2019-02-22 MED ORDER — CLINDAMYCIN PHOSPHATE 900 MG/50ML IV SOLN: 900.0000 mg | Freq: Three times a day (TID) | INTRAVENOUS | Status: DC

## 2019-02-22 MED ORDER — BUTORPHANOL TARTRATE 1 MG/ML IJ SOLN
INTRAMUSCULAR | Status: AC
  Filled 2019-02-22: qty 2

## 2019-02-22 MED ORDER — ONDANSETRON HCL 4 MG/2ML IJ SOLN
INTRAMUSCULAR | Status: AC
  Filled 2019-02-22: qty 2

## 2019-02-22 MED ORDER — MIDAZOLAM HCL 2 MG/2ML IJ SOLN
INTRAMUSCULAR | Status: AC
  Filled 2019-02-22: qty 2

## 2019-02-22 MED ORDER — MAGNESIUM SULFATE BOLUS VIA INFUSION
4.0000 g | Freq: Once | INTRAVENOUS | Status: AC
  Administered 2019-02-22: 4 g via INTRAVENOUS
  Filled 2019-02-22: qty 1000

## 2019-02-22 MED ORDER — PROPOFOL 10 MG/ML IV BOLUS
INTRAVENOUS | Status: DC | PRN
  Administered 2019-02-22 (×5): 20 mg via INTRAVENOUS

## 2019-02-22 MED ORDER — STERILE WATER FOR IRRIGATION IR SOLN
Status: DC | PRN
  Administered 2019-02-22: 1000 mL

## 2019-02-22 MED ORDER — NITROGLYCERIN 0.2 MG/ML ON CALL CATH LAB
INTRAVENOUS | Status: DC | PRN
  Administered 2019-02-22: 50 ug via INTRAVENOUS

## 2019-02-22 MED ORDER — SODIUM CHLORIDE 0.9 % IV SOLN: 2.0000 g | Freq: Once | INTRAVENOUS | Status: DC

## 2019-02-22 MED ORDER — ACETAMINOPHEN 10 MG/ML IV SOLN
1000.0000 mg | Freq: Once | INTRAVENOUS | Status: AC
  Administered 2019-02-22: 1000 mg via INTRAVENOUS

## 2019-02-22 MED ORDER — CALCIUM CARBONATE ANTACID 500 MG PO CHEW: 2.0000 | CHEWABLE_TABLET | ORAL | Status: DC | PRN

## 2019-02-22 MED ORDER — ONDANSETRON HCL 4 MG/2ML IJ SOLN
INTRAMUSCULAR | Status: DC | PRN
  Administered 2019-02-22: 4 mg via INTRAVENOUS

## 2019-02-22 MED ORDER — SOD CITRATE-CITRIC ACID 500-334 MG/5ML PO SOLN
30.0000 mL | Freq: Once | ORAL | Status: AC
  Administered 2019-02-22: 14:00:00 30 mL via ORAL

## 2019-02-22 MED ORDER — IBUPROFEN 200 MG PO TABS: 600.0000 mg | ORAL_TABLET | Freq: Four times a day (QID) | ORAL | 0 refills | Status: DC | PRN

## 2019-02-22 MED ORDER — MIDAZOLAM HCL 2 MG/2ML IJ SOLN
INTRAMUSCULAR | Status: DC | PRN
  Administered 2019-02-22 (×2): 1 mg via INTRAVENOUS

## 2019-02-22 MED ORDER — DOCUSATE SODIUM 100 MG PO CAPS: 100.0000 mg | ORAL_CAPSULE | Freq: Every day | ORAL | Status: DC

## 2019-02-22 MED ORDER — ACETAMINOPHEN 10 MG/ML IV SOLN
INTRAVENOUS | Status: AC
  Filled 2019-02-22: qty 100

## 2019-02-22 MED ORDER — MISOPROSTOL 200 MCG PO TABS
ORAL_TABLET | ORAL | Status: AC
  Filled 2019-02-22: qty 4

## 2019-02-22 MED ORDER — IBUPROFEN 600 MG PO TABS
600.0000 mg | ORAL_TABLET | Freq: Four times a day (QID) | ORAL | Status: DC | PRN
  Filled 2019-02-22: qty 1

## 2019-02-22 MED ORDER — OXYTOCIN 40 UNITS IN NORMAL SALINE INFUSION - SIMPLE MED
INTRAVENOUS | Status: AC
  Filled 2019-02-22: qty 1000

## 2019-02-22 MED ORDER — ZOLPIDEM TARTRATE 5 MG PO TABS: 5.0000 mg | ORAL_TABLET | Freq: Every evening | ORAL | Status: DC | PRN

## 2019-02-22 MED ORDER — METHYLERGONOVINE MALEATE 0.2 MG/ML IJ SOLN
INTRAMUSCULAR | Status: DC | PRN
  Administered 2019-02-22: 0.2 mg via INTRAMUSCULAR

## 2019-02-22 MED ORDER — LACTATED RINGERS IV SOLN
INTRAVENOUS | Status: DC
  Administered 2019-02-22: 09:00:00 via INTRAVENOUS

## 2019-02-22 MED ORDER — DEXAMETHASONE SODIUM PHOSPHATE 10 MG/ML IJ SOLN
INTRAMUSCULAR | Status: AC
  Filled 2019-02-22: qty 1

## 2019-02-22 MED ORDER — HYDROMORPHONE HCL 1 MG/ML IJ SOLN
0.2500 mg | INTRAMUSCULAR | Status: DC | PRN
  Administered 2019-02-22 (×2): 0.5 mg via INTRAVENOUS

## 2019-02-22 MED ORDER — KETOROLAC TROMETHAMINE 30 MG/ML IJ SOLN: 30.0000 mg | Freq: Once | INTRAMUSCULAR | Status: DC | PRN

## 2019-02-22 MED ORDER — SOD CITRATE-CITRIC ACID 500-334 MG/5ML PO SOLN
ORAL | Status: AC
  Filled 2019-02-22: qty 30

## 2019-02-22 MED ORDER — KETOROLAC TROMETHAMINE 30 MG/ML IJ SOLN
INTRAMUSCULAR | Status: AC
  Filled 2019-02-22: qty 1

## 2019-02-22 MED ORDER — ONDANSETRON HCL 4 MG/2ML IJ SOLN
4.0000 mg | Freq: Four times a day (QID) | INTRAMUSCULAR | Status: DC | PRN
  Administered 2019-02-22: 08:00:00 4 mg via INTRAVENOUS
  Filled 2019-02-22: qty 2

## 2019-02-22 MED ORDER — FENTANYL CITRATE (PF) 100 MCG/2ML IJ SOLN
INTRAMUSCULAR | Status: DC | PRN
  Administered 2019-02-22 (×2): 50 ug via INTRAVENOUS

## 2019-02-22 MED ORDER — FENTANYL CITRATE (PF) 100 MCG/2ML IJ SOLN
50.0000 ug | Freq: Once | INTRAMUSCULAR | Status: AC
  Administered 2019-02-22: 50 ug via INTRAVENOUS
  Filled 2019-02-22: qty 2

## 2019-02-22 MED ORDER — HYDROMORPHONE HCL 1 MG/ML IJ SOLN
INTRAMUSCULAR | Status: AC
  Filled 2019-02-22: qty 1

## 2019-02-22 MED ORDER — SCOPOLAMINE 1 MG/3DAYS TD PT72
MEDICATED_PATCH | TRANSDERMAL | Status: AC
  Filled 2019-02-22: qty 1

## 2019-02-22 MED ORDER — OXYCODONE HCL 5 MG/5ML PO SOLN: 5.0000 mg | Freq: Once | ORAL | Status: DC | PRN

## 2019-02-22 MED ORDER — LACTATED RINGERS IV SOLN
INTRAVENOUS | Status: DC
  Administered 2019-02-22 (×4): via INTRAVENOUS

## 2019-02-22 MED ORDER — PROPOFOL 10 MG/ML IV BOLUS
INTRAVENOUS | Status: AC
  Filled 2019-02-22: qty 20

## 2019-02-22 MED ORDER — MISOPROSTOL 200 MCG PO TABS
800.0000 ug | ORAL_TABLET | ORAL | Status: AC
  Administered 2019-02-22: 12:00:00 800 ug via VAGINAL

## 2019-02-22 MED ORDER — INDOMETHACIN 25 MG PO CAPS
50.0000 mg | ORAL_CAPSULE | Freq: Once | ORAL | Status: AC
  Administered 2019-02-22: 50 mg via ORAL
  Filled 2019-02-22: qty 2

## 2019-02-22 MED ORDER — BUTORPHANOL TARTRATE 1 MG/ML IJ SOLN
2.0000 mg | Freq: Once | INTRAMUSCULAR | Status: AC
  Administered 2019-02-22: 2 mg via INTRAVENOUS

## 2019-02-22 MED ORDER — NITROGLYCERIN IN D5W 200-5 MCG/ML-% IV SOLN
INTRAVENOUS | Status: AC
  Filled 2019-02-22: qty 250

## 2019-02-22 MED ORDER — ACETAMINOPHEN 325 MG PO TABS: 650.0000 mg | ORAL_TABLET | ORAL | Status: DC | PRN

## 2019-02-22 MED ORDER — SCOPOLAMINE 1 MG/3DAYS TD PT72
MEDICATED_PATCH | TRANSDERMAL | Status: DC | PRN
  Administered 2019-02-22: 1 via TRANSDERMAL

## 2019-02-22 MED ORDER — ONDANSETRON HCL 4 MG/2ML IJ SOLN: 4.0000 mg | Freq: Once | INTRAMUSCULAR | Status: DC | PRN

## 2019-02-22 MED ORDER — METHYLERGONOVINE MALEATE 0.2 MG/ML IJ SOLN
INTRAMUSCULAR | Status: AC
  Filled 2019-02-22: qty 1

## 2019-02-22 MED ORDER — FENTANYL CITRATE (PF) 100 MCG/2ML IJ SOLN
INTRAMUSCULAR | Status: AC
  Filled 2019-02-22: qty 2

## 2019-02-22 MED ORDER — SODIUM CHLORIDE 0.9 % IV SOLN
2.0000 g | INTRAVENOUS | Status: DC
  Administered 2019-02-22: 2 g via INTRAVENOUS
  Filled 2019-02-22 (×2): qty 20

## 2019-02-22 MED ORDER — KETOROLAC TROMETHAMINE 30 MG/ML IJ SOLN
INTRAMUSCULAR | Status: DC | PRN
  Administered 2019-02-22: 30 mg via INTRAVENOUS

## 2019-02-22 MED ORDER — MAGNESIUM SULFATE 40 GM/1000ML IV SOLN
2.0000 g/h | INTRAVENOUS | Status: DC
  Administered 2019-02-22: 09:00:00 2 g/h via INTRAVENOUS
  Filled 2019-02-22: qty 1000

## 2019-02-22 MED ORDER — DEXAMETHASONE SODIUM PHOSPHATE 10 MG/ML IJ SOLN
INTRAMUSCULAR | Status: DC | PRN
  Administered 2019-02-22: 10 mg via INTRAVENOUS

## 2019-02-22 MED ORDER — PRENATAL MULTIVITAMIN CH: 1.0000 | ORAL_TABLET | Freq: Every day | ORAL | Status: DC

## 2019-02-22 SURGICAL SUPPLY — 11 items
CLOTH BEACON ORANGE TIMEOUT ST (SAFETY) ×2 IMPLANT
DECANTER SPIKE VIAL GLASS SM (MISCELLANEOUS) ×2 IMPLANT
GLOVE BIO SURGEON STRL SZ 6.5 (GLOVE) ×4 IMPLANT
GLOVE BIOGEL PI IND STRL 7.0 (GLOVE) ×1 IMPLANT
GLOVE BIOGEL PI INDICATOR 7.0 (GLOVE) ×1
GOWN STRL REUS W/TWL LRG LVL3 (GOWN DISPOSABLE) ×4 IMPLANT
NS IRRIG 1000ML POUR BTL (IV SOLUTION) ×2 IMPLANT
PACK VAGINAL MINOR WOMEN LF (CUSTOM PROCEDURE TRAY) ×2 IMPLANT
PAD OB MATERNITY 4.3X12.25 (PERSONAL CARE ITEMS) ×2 IMPLANT
PAD PREP 24X48 CUFFED NSTRL (MISCELLANEOUS) ×2 IMPLANT
TOWEL OR 17X24 6PK STRL BLUE (TOWEL DISPOSABLE) ×4 IMPLANT

## 2019-02-22 NOTE — H&P (Signed)
Heather Herrera is a 33 y.o. G 2 P 0010 at 22 w 2 days presents with lower abdominal cramping. Patient was here on November 30 - believed to have UTI and cervix was closed at that time. Pain has persisted - yesterday felt like cramps. Today - Cervix is 3 cm dilated and with BBOW She has had a LEEP cone biopsy    OB History    Gravida  2   Para      Term      Preterm      AB  1   Living        SAB  1   TAB      Ectopic      Multiple      Live Births             Past Medical History:  Diagnosis Date  . ADHD (attention deficit hyperactivity disorder)   . Palpitations    Past Surgical History:  Procedure Laterality Date  . BREAST ENHANCEMENT SURGERY    . CERVICAL BIOPSY  W/ LOOP ELECTRODE EXCISION    . no surgical hx    . TONSILLECTOMY    . WISDOM TOOTH EXTRACTION     Family History: family history is not on file. Social History:  reports that she has never smoked. She has never used smokeless tobacco. She reports that she does not drink alcohol or use drugs.     Maternal Diabetes: No Genetic Screening: Normal Maternal Ultrasounds/Referrals: Normal Fetal Ultrasounds or other Referrals:  None Maternal Substance Abuse:  No Significant Maternal Medications:  None Significant Maternal Lab Results:  None Other Comments:  None  Review of Systems  All other systems reviewed and are negative.  Maternal Medical History:  Reason for admission: Contractions.     Dilation: 2.5 Exam by:: Carmelia Roller CNM Blood pressure 124/74, pulse 85, temperature 98.4 F (36.9 C), resp. rate 20, height 5\' 5"  (1.651 m), weight 65.9 kg. Maternal Exam:  Uterine Assessment: Contraction strength is moderate.  Contraction frequency is irregular.      Fetal Exam Fetal State Assessment: Category I - tracings are normal.     Physical Exam  Nursing note and vitals reviewed. Constitutional: She appears well-developed and well-nourished.  HENT:  Head: Normocephalic and  atraumatic.  Eyes: Pupils are equal, round, and reactive to light.  Neck: Normal range of motion.  Cardiovascular: Normal rate and regular rhythm.  Respiratory: Effort normal.  GI: Soft.    Prenatal labs: ABO, Rh: --/--/O POS Performed at [facility], [place] [address] [place], [place] [address]  (09/04 2252) Antibody:   Rubella:   RPR:    HBsAg:    HIV:    GBS:     Assessment/Plan: IUP at 22 w 2 days PTL  ?cervical incompetence with history of LEEP  Had extensive discussion with patient and her spouse - discussed with them that she is previable and that baby could not survive if delivered at this point At risk for PPROM Plan Admit  Tredelenburg Indocin Start Magnesium for uterine relaxation Rocephin MFM consultation - will discuss possibility for emergency cerclage IF patient's contractions resolve   Cyril Mourning 02/22/2019, 8:24 AM

## 2019-02-22 NOTE — Consult Note (Signed)
MFM Consult  I met with Heather Herrera a G2P0 who is being seen at the request of Dian Queen, MD regarding cervical incompetence.  Heather Herrera had an unfortunate delivery of a 17 week previable infant today. She notes that her symptoms of discomfort began on Monday, with suspected urinary tract infection. On Tuesday her symptoms worsened to include back pressure and cramping. She arrived at MAU and was noted to be 3 cm dilated, a tocolytic was prescribed by precipitously delivered within 2 hours.  She is quite grieved at this time.    BP 102/66 (BP Location: Left Arm)   Pulse 90   Temp 98.2 F (36.8 C) (Oral)   Resp 19   Ht '5\' 5"'$  (1.651 m)   Wt 65.9 kg   SpO2 99%   BMI 24.18 kg/m    CBC Latest Ref Rng & Units 02/22/2019 11/25/2018  WBC 4.0 - 10.5 K/uL 16.0(H) 10.6(H)  Hemoglobin 12.0 - 15.0 g/dL 12.3 12.6  Hematocrit 36.0 - 46.0 % 36.3 37.3  Platelets 150 - 400 K/uL 228 257      OB History    Gravida  2   Para      Term      Preterm      AB  1   Living        SAB  1   TAB      Ectopic      Multiple      Live Births             Past Surgical History:  Procedure Laterality Date  . BREAST ENHANCEMENT SURGERY    . CERVICAL BIOPSY  W/ LOOP ELECTRODE EXCISION    . DILATION AND CURETTAGE OF UTERUS N/A 02/22/2019   Procedure: DILATATION AND CURETTAGE;  Surgeon: Dian Queen, MD;  Location: [facility] LD ORS;  Service: Gynecology;  Laterality: N/A;  . no surgical hx    . TONSILLECTOMY    . WISDOM TOOTH EXTRACTION     Past Medical History:  Diagnosis Date  . ADHD (attention deficit hyperactivity disorder)   . Palpitations    Social History   Socioeconomic History  . Marital status: Married    Spouse name: Not on file  . Number of children: Not on file  . Years of education: Not on file  . Highest education level: Not on file  Occupational History  . Not on file  Social Needs  . Financial resource strain: Not on file  . Food insecurity    Worry: Not on  file    Inability: Not on file  . Transportation needs    Medical: Not on file    Non-medical: Not on file  Tobacco Use  . Smoking status: Never Smoker  . Smokeless tobacco: Never Used  Substance and Sexual Activity  . Alcohol use: No  . Drug use: No  . Sexual activity: Yes    Birth control/protection: None  Lifestyle  . Physical activity    Days per week: Not on file    Minutes per session: Not on file  . Stress: Not on file  Relationships  . Social Herbalist on phone: Not on file    Gets together: Not on file    Attends religious service: Not on file    Active member of club or organization: Not on file    Attends meetings of clubs or organizations: Not on file    Relationship status: Not on file  . Intimate  partner violence    Fear of current or ex partner: Not on file    Emotionally abused: Not on file    Physically abused: Not on file    Forced sexual activity: Not on file  Other Topics Concern  . Not on file  Social History Narrative  . Not on file   No current facility-administered medications on file prior to encounter.    No current outpatient medications on file prior to encounter.    Impression/Counseling  I discussed Ms. Heather Herrera symptoms and reviewed a differential diagnosis included preterm labor vs cervical incompetence. I discussed that given her cramping this could be due to preterm labor secondary to subclinical infection verses cervical incompetence, defined by painless cervical dilation.  I reviewed the risk of recurrence and discussed that she would be a candidate for a history indicated cerclage. In addition we discussed the various management option including expectant management with serial cervical length, vs CL +progesterone vs cerclage and vaginal progesterone.  At this time I recommended a having a history indicated cerclage placed at 13 weeks in her next pregnancy. She agreed with this plan as she would like to remove any risk for  subsequent mid trimester delivery.   She will follow up prior to pursing her next pregnancy. We discussed the grieving process and the timing to pursue the next pregnancy.   All questions answered   I spent 45 minute with > 50% in face to face consultation.  Vikki Ports, MD

## 2019-02-22 NOTE — Progress Notes (Signed)
Pt states she got her Macrobid prescription filled & has taken every dose beginning 11/30 at 1200.  Also states she was prescribed Pyridium by OB MD & has taken doses since then; none has been helpful.

## 2019-02-22 NOTE — Progress Notes (Signed)
Exam in room with speculum and ring forceps  Unable to retrieve placenta completely  Cervix appears to be contracted around placenta Recommend proceeding with uterine curettage to remove retained placenta OR notified

## 2019-02-22 NOTE — Progress Notes (Signed)
Called by Patient's nurse that she felt pressure   As I was on my way to the hospital  A second phone call from the floor that she was delivering When I arrived, baby completely delivered in the intact bag in vertex position Was a female infant Appeared grossly normal No visible respirations noted Cord clamped  Handed to mother for comfort care 800 mcg Cytotec placed in vagina  Will recheck placenta in a few minutes Bleeding minimal  I strongly believe this is cervical incompetence  Recommend MFM consult today  Also I am requesting NICU to come and speak to parents about inability to resuscitate baby because it is previable

## 2019-02-22 NOTE — Brief Op Note (Signed)
02/22/2019  3:38 PM  PATIENT:  Heather Herrera  33 y.o. female  PRE-OPERATIVE DIAGNOSIS:    22 week delivery Retained products of conception  POST-OPERATIVE DIAGNOSIS:    Same  PROCEDURE Uterine Curettage  SURGEON:  Surgeon(s) and Role:    * Dian Queen, MD - Primary  PHYSICIAN ASSISTANT:   ASSISTANTS: none   ANESTHESIA:   IV sedation  EBL:  10 mL   BLOOD ADMINISTERED:none  DRAINS: none   LOCAL MEDICATIONS USED:  NONE  SPECIMEN:  Source of Specimen:  uterine curettings  DISPOSITION OF SPECIMEN:  PATHOLOGY  COUNTS:  YES  TOURNIQUET:  * No tourniquets in log *  DICTATION: .Other Dictation: Dictation Number dictated  PLAN OF CARE: Discharge to home after PACU  PATIENT DISPOSITION:  PACU - hemodynamically stable.   Delay start of Pharmacological VTE agent (>24hrs) due to surgical blood loss or risk of bleeding: not applicable

## 2019-02-22 NOTE — Anesthesia Preprocedure Evaluation (Addendum)
Anesthesia Evaluation  Patient identified by MRN, date of birth, ID band Patient awake    Reviewed: Allergy & Precautions, NPO status , Patient's Chart, lab work & pertinent test results  Airway Mallampati: II  TM Distance: >3 FB Neck ROM: Full    Dental no notable dental hx. (+) Teeth Intact   Pulmonary    Pulmonary exam normal breath sounds clear to auscultation       Cardiovascular Exercise Tolerance: Good Normal cardiovascular exam Rhythm:Regular Rate:Normal     Neuro/Psych ADHD   GI/Hepatic Neg liver ROS,   Endo/Other  negative endocrine ROS  Renal/GU negative Renal ROS     Musculoskeletal   Abdominal   Peds  Hematology Hgb 12.3 Plt 223   Anesthesia Other Findings   Reproductive/Obstetrics                             Anesthesia Physical Anesthesia Plan  ASA: II  Anesthesia Plan: MAC   Post-op Pain Management:    Induction: Intravenous  PONV Risk Score and Plan: 3 and Treatment may vary due to age or medical condition, Ondansetron and Dexamethasone  Airway Management Planned: Nasal Cannula and Natural Airway  Additional Equipment: None  Intra-op Plan:   Post-operative Plan:   Informed Consent: I have reviewed the patients History and Physical, chart, labs and discussed the procedure including the risks, benefits and alternatives for the proposed anesthesia with the patient or authorized representative who has indicated his/her understanding and acceptance.     Dental advisory given  Plan Discussed with: CRNA  Anesthesia Plan Comments: (D&E for retained placenta at 22wks)       Anesthesia Quick Evaluation

## 2019-02-22 NOTE — Discharge Summary (Signed)
Admission Diagnosis: IUP at 22 w 2 days Preterm labor  Discharge Diagnosis: Same Probable cervical incompetence Retained products of conception    Hospital Course: Patient is a 33 year old G 1 P 0 at 45 w 2 days presents with abdominal pain, contractions and noted to be 3 cm with BBOW. She was admitted to [facility] specialty care unit and given Indocin, Rocephin and Magnesium sulfate. Unfortunately, she delivered about 4 hours after arrival the fetus with bag intact.   The fetus was a female and was pre viable.  With cytotec after delivery, the placenta was not delivered and I recommended uterine curettage. She went to the OR and underwent uterine curettage.  Patient was discharged home after observation. She was advised to follow up with Dr. Royston Sinner in 2 weeks  BP 105/74   Pulse 73   Temp 98.1 F (36.7 C) (Oral)   Resp 14   Ht 5\' 5"  (1.651 m)   Wt 65.9 kg   SpO2 99%   BMI 24.18 kg/m  Results for orders placed or performed during the hospital encounter of 02/22/19 (from the past 24 hour(s))  Type and screen [place]     Status: None   Collection Time: 02/22/19  7:48 AM  Result Value Ref Range   ABO/RH(D) O POS    Antibody Screen NEG    Sample Expiration      02/25/2019,2359 Performed at [facility], [place] [address] [place], [place] [address]   CBC on admission     Status: Abnormal   Collection Time: 02/22/19  8:01 AM  Result Value Ref Range   WBC 16.0 (H) 4.0 - 10.5 K/uL   RBC 4.02 3.87 - 5.11 MIL/uL   Hemoglobin 12.3 12.0 - 15.0 g/dL   HCT 36.3 36.0 - 46.0 %   MCV 90.3 80.0 - 100.0 fL   MCH 30.6 26.0 - 34.0 pg   MCHC 33.9 30.0 - 36.0 g/dL   RDW 12.3 11.5 - 15.5 %   Platelets 228 150 - 400 K/uL   nRBC 0.0 0.0 - 0.2 %  SARS CORONAVIRUS 2 (TAT 6-24 HRS) Nasopharyngeal Nasopharyngeal Swab     Status: None   Collection Time: 02/22/19  8:28 AM   Specimen: Nasopharyngeal Swab  Result Value Ref Range   SARS Coronavirus 2 NEGATIVE NEGATIVE

## 2019-02-22 NOTE — Anesthesia Postprocedure Evaluation (Signed)
Anesthesia Post Note  Patient: Heather Herrera  Procedure(s) Performed: DILATATION AND CURETTAGE (N/A Vagina )     Patient location during evaluation: PACU Anesthesia Type: MAC Level of consciousness: awake and alert Pain management: pain level controlled Vital Signs Assessment: post-procedure vital signs reviewed and stable Respiratory status: spontaneous breathing, nonlabored ventilation, respiratory function stable and patient connected to nasal cannula oxygen Cardiovascular status: stable and blood pressure returned to baseline Postop Assessment: no apparent nausea or vomiting Anesthetic complications: no    Last Vitals:  Vitals:   02/22/19 1545 02/22/19 1600  BP: 105/74 95/68  Pulse: 73 75  Resp: 14 15  Temp:    SpO2: 99% 100%    Last Pain:  Vitals:   02/22/19 1615  TempSrc:   PainSc: 5    Pain Goal: Patients Stated Pain Goal: 3 (02/22/19 1100)              Epidural/Spinal Function Cutaneous sensation: Normal sensation (02/22/19 1540), Patient able to flex knees: Yes (02/22/19 1540), Patient able to lift hips off bed: Yes (02/22/19 1540), Back pain beyond tenderness at insertion site: No (02/22/19 1540), Progressively worsening motor and/or sensory loss: No (02/22/19 1540), Bowel and/or bladder incontinence post epidural: No (02/22/19 1540)  [place]et [place]

## 2019-02-22 NOTE — Addendum Note (Signed)
Addendum  created 02/22/19 1634 by Asher Muir, CRNA   Attestation recorded in [place], [place] accepted, [employer] filed, Intraprocedure Event edited, Agricultural consultant edited

## 2019-02-22 NOTE — MAU Provider Note (Signed)
Chief Complaint:  Back Pain   First Provider Initiated Contact with Patient 02/22/19 [phone]      HPI: Heather Herrera is a 33 y.o. G2P0010 at 46w2dwho presents to maternity admissions reporting sharp stabbing pains in low back which wrap around to lower abdomen.  Has had it all week.  Treated two days ago for UTI but culture came back negative.  . She reports good fetal movement, denies LOF, vaginal bleeding, vaginal itching/burning, urinary symptoms, h/a, dizziness, n/v, diarrhea, constipation or fever/chills.    RN Note: PT SAYS SHE WAS HERE ON MON AT 0600- WITH CRAMPING- GAVE ANTX . SINCE THEN WORSE - Monday NIGHT - HAD STABBING PAIN . NOW PAIN IN HER BACK - STABBING. . PNC  WITH DR LEGER- SHE CALLED THEM YESTERDAY - CALLED IN RX-  DID NOT HELP .   Past Medical History: Past Medical History:  Diagnosis Date  . ADHD (attention deficit hyperactivity disorder)   . Palpitations     Past obstetric history: OB History  Gravida Para Term Preterm AB Living  2       1    SAB TAB Ectopic Multiple Live Births  1            # Outcome Date GA Lbr Len/2nd Weight Sex Delivery Anes PTL Lv  2 Current           1 SAB 08/2018            Past Surgical History: Past Surgical History:  Procedure Laterality Date  . BREAST ENHANCEMENT SURGERY    . CERVICAL BIOPSY  W/ LOOP ELECTRODE EXCISION    . no surgical hx    . TONSILLECTOMY    . WISDOM TOOTH EXTRACTION      Family History: Family History  Problem Relation Age of Onset  . Heart attack Neg Hx   . Sudden death Neg Hx     Social History: Social History   Tobacco Use  . Smoking status: Never Smoker  . Smokeless tobacco: Never Used  Substance Use Topics  . Alcohol use: No  . Drug use: No    Allergies:  Allergies  Allergen Reactions  . Azithromycin Hives  . Penicillins Hives    Meds:  Medications Prior to Admission  Medication Sig Dispense Refill Last Dose  . clindamycin-tretinoin (ZIANA) gel Apply topically at bedtime as  needed.     . Multiple Vitamins tablet Take 1 tablet by mouth daily.     . nitrofurantoin, macrocrystal-monohydrate, (MACROBID) 100 MG capsule Take 1 capsule (100 mg total) by mouth 2 (two) times daily. 14 capsule 0   . Prenatal Vit-Fe Fumarate-FA (MULTIVITAMIN-PRENATAL) 27-0.8 MG TABS tablet Take 1 tablet by mouth daily at 12 noon.       I have reviewed patient's Past Medical Hx, Surgical Hx, Family Hx, Social Hx, medications and allergies.   ROS:  Review of Systems  Constitutional: Negative for chills and fever.  Respiratory: Negative for shortness of breath.   Gastrointestinal: Positive for abdominal pain. Negative for constipation, diarrhea and nausea.  Genitourinary: Positive for pelvic pain. Negative for dysuria, flank pain, vaginal bleeding and vaginal discharge.  Musculoskeletal: Positive for back pain.   Other systems negative  Physical Exam   Patient Vitals for the past 24 hrs:  BP Temp Pulse Resp Height Weight  02/22/19 0702 124/74 98.4 F (36.9 C) 85 20 5\' 5"  (1.651 m) 65.9 kg   Constitutional: Well-developed, well-nourished female in no acute distress, but quite uncomfortable.  Cardiovascular:  normal rate and rhythm Respiratory: normal effort, clear to auscultation bilaterally GI: Abd soft, non-tender, gravid appropriate for gestational age.   No rebound or guarding. MS: Extremities nontender, no edema, normal ROM Neurologic: Alert and oriented x 4.  GU: Neg CVAT.  PELVIC EXAM: Cervix pink, dilated about 3cm, fully effaced.     Scant clear discharge, vaginal walls and external genitalia normal   FHT:   147             Toco shows uterine irritability  Labs:  --/--/O POS Performed at [facility], [place] [address] [place], [place] [address]  (09/04 2252)  Imaging:   MAU Course/MDM: I have ordered Trendelenberg position, IV fluids, Indomethacin and antibiotics.;   Antibiotics discussed with Pharmacy because of her allergies to PCN and Azithro.  Will  likely use [place] and [place]   Consult Dr Nehemiah Settle and Helane Rima with presentation, exam findings and test results.  Dr Helane Rima will be here shortly    Assessment: Single intrauterine pregnancy at [redacted]w[redacted]d Preterm Labor Preterm effacement with bulging membranes   Plan: MD to follow  Hansel Feinstein CNM, MSN Certified Nurse-Midwife 02/22/2019 7:21 AM

## 2019-02-22 NOTE — Anesthesia Procedure Notes (Signed)
Date/Time: 02/22/2019 2:58 PM Performed by: Asher Muir, CRNA

## 2019-02-22 NOTE — Progress Notes (Signed)
1145 pt calling out saying she feels pressure. Dr. Helane Rima notified, will come see patient. 1150 Head of baby out. Dr Helane Rima notified and in on the way to hospital.

## 2019-02-22 NOTE — Progress Notes (Signed)
Baby delivered in sac 1203

## 2019-02-22 NOTE — Transfer of Care (Signed)
Immediate Anesthesia Transfer of Care Note  Patient: Heather Herrera  Procedure(s) Performed: DILATATION AND CURETTAGE (N/A Vagina )  Patient Location: PACU  Anesthesia Type:MAC  Level of Consciousness: sedated  Airway & Oxygen Therapy: Patient Spontanous Breathing  Post-op Assessment: Report given to RN  Post vital signs: Reviewed and stable  Last Vitals:  Vitals Value Taken Time  BP 97/73 02/22/19 1540  Temp 36.7 C 02/22/19 1540  Pulse 82 02/22/19 1545  Resp 13 02/22/19 1545  SpO2 98 % 02/22/19 1545  Vitals shown include unvalidated device data.  Last Pain:  Vitals:   02/22/19 1540  TempSrc: Oral  PainSc:       Patients Stated Pain Goal: 3 ([id number])  Complications: No apparent anesthesia complications

## 2019-02-22 NOTE — Progress Notes (Signed)
Offered bereavement support to [employer] and Thurmond Butts as they continue to process the shock of losing their baby, Beau.  Provided them with my card for follow up support.  [place], [place] Pager, [phone] 5:24 PM

## 2019-02-22 NOTE — MAU Note (Signed)
PT SAYS SHE WAS HERE ON MON AT 0600- WITH CRAMPING- GAVE ANTX . SINCE THEN WORSE - Monday NIGHT - HAD STABBING PAIN . NOW PAIN IN HER BACK - STABBING. . PNC  WITH DR LEGER- SHE CALLED THEM YESTERDAY - CALLED IN RX-  DID NOT HELP .

## 2019-02-23 ENCOUNTER — Encounter (HOSPITAL_COMMUNITY): Payer: Self-pay

## 2019-02-24 LAB — SURGICAL PATHOLOGY

## 2019-04-03 DIAGNOSIS — Z1389 Encounter for screening for other disorder: Secondary | ICD-10-CM | POA: Diagnosis not present

## 2019-04-03 DIAGNOSIS — N939 Abnormal uterine and vaginal bleeding, unspecified: Secondary | ICD-10-CM | POA: Diagnosis not present

## 2019-04-03 DIAGNOSIS — D259 Leiomyoma of uterus, unspecified: Secondary | ICD-10-CM | POA: Diagnosis not present

## 2019-04-03 DIAGNOSIS — D509 Iron deficiency anemia, unspecified: Secondary | ICD-10-CM | POA: Diagnosis not present

## 2019-04-03 DIAGNOSIS — N96 Recurrent pregnancy loss: Secondary | ICD-10-CM | POA: Diagnosis not present

## 2019-04-10 ENCOUNTER — Encounter (HOSPITAL_BASED_OUTPATIENT_CLINIC_OR_DEPARTMENT_OTHER): Payer: Self-pay | Admitting: Obstetrics and Gynecology

## 2019-04-10 ENCOUNTER — Other Ambulatory Visit (HOSPITAL_COMMUNITY): Payer: Self-pay | Admitting: Obstetrics and Gynecology

## 2019-04-10 ENCOUNTER — Other Ambulatory Visit: Payer: Self-pay | Admitting: Obstetrics and Gynecology

## 2019-04-10 ENCOUNTER — Other Ambulatory Visit: Payer: Self-pay

## 2019-04-10 DIAGNOSIS — N939 Abnormal uterine and vaginal bleeding, unspecified: Secondary | ICD-10-CM | POA: Diagnosis not present

## 2019-04-10 NOTE — Progress Notes (Signed)
Spoke w/ via phone for pre-op interview--- PT Lab needs dos----   Urine preg, T&S            Lab results------ no COVID test ------ 04-11-2019 @ [id number] Arrive at ------- 0530 NPO after ------ MN Medications to take morning of surgery ----- NONE Diabetic medication ----- n/a Patient Special Instructions ----- n/a Pre-Op special Istructions ----- n/a Patient verbalized understanding of instructions that were given at this phone interview. Patient denies shortness of breath, chest pain, fever, cough a this phone interview.

## 2019-04-10 NOTE — H&P (Signed)
Heather Herrera is an 34 y.o. female. presenting for D&C under US guidance with suspected retained products of conception. Suffered a pregnancy loss at 8 wga suspected to be s/s cervical insufficiency. She had a D&C for rPOC immediately pp. Was doing well overall postpartum - healing emotionally. However, had vaginal bleeding consistently postpartum that picked up to heavy last week accompanied by cramping. Given heavier bleeding, [place] was performed and demonstrated a thickened EMS to 3.3cm with evidence of blood flow c/f rPOC.   Pertinent Gynecological History: Menses: flow is moderate Prev STI: HPV, s/p LEEP G2P0110: G1: SAB, G2: PTD at 67 wga. D&C for rPOC.    Menstrual History: No LMP recorded.    Past Medical History:  Diagnosis Date  . ADHD (attention deficit hyperactivity disorder)   . Palpitations     Past Surgical History:  Procedure Laterality Date  . BREAST ENHANCEMENT SURGERY    . CERVICAL BIOPSY  W/ LOOP ELECTRODE EXCISION    . DILATION AND CURETTAGE OF UTERUS N/A 02/22/2019   Procedure: DILATATION AND CURETTAGE;  Surgeon: Dian Queen, MD;  Location: [facility] LD ORS;  Service: Gynecology;  Laterality: N/A;  . no surgical hx    . TONSILLECTOMY    . WISDOM TOOTH EXTRACTION      Family History  Problem Relation Age of Onset  . Heart attack Neg Hx   . Sudden death Neg Hx     Social History:  reports that she has never smoked. She has never used smokeless tobacco. She reports that she does not drink alcohol or use drugs.  Allergies:  Allergies  Allergen Reactions  . Azithromycin Hives  . Penicillins Hives    No medications prior to admission.    Review of Systems  There were no vitals taken for this visit. Physical Exam  (From office 04/04/19) Gen: well appearing, NAD CV: Reg rate Pulm: NWOB Abd: soft, nondistended, nontender, no masses GYN: uterus 16 week size with ~8cm posterior fibroid palpated, no adnexa ttp/CMT. Scant old mucous/brown blood Ext: No edema  b/l   No results found for this or any previous visit (from the past 24 hour(s)).  No results found. TVUS in office 04/10/19: 3.3cm thickened EMS with blood flow c/f retained POCs. 8cm fibroid abutting EMS. 72mm intramural fibroid. Unremarkable ovaries. No FF.   Assessment/Plan: 34 yo G2P0110 presenting for D&C under TVUS guidance for suspected rPOC after experiencing a PTB at 83 wga suspected s/s cervical insufficiency (hx LEEP) that required D&C immediately pp. TVUS c/f rPOC with BF and thickened EMS. She was counseled in the office regarding management options and elected a D&C. Given enlarged fibroid uterus with 8cm posterior fibroid impinging on EMS, it is prudent to have TVUS guidance during D&C. She is at high risk of uterine perforation given she is postpartum and her 8cm fibroid. We discussed the risks of failure, scarring, uterine perforation, damage to surrounding structures and the need for additional procedures. If TVUS guidance inconclusive, will plan on hysteroscopy and resection if needed. We discussed typical postoperative course and pelvic rest. Plan for doxycyline x 1 week BID postop and rx was given in the office. She is RH positive.    Tyson Dense 04/10/2019, 2:45 PM

## 2019-04-11 ENCOUNTER — Other Ambulatory Visit (HOSPITAL_COMMUNITY)
Admission: RE | Admit: 2019-04-11 | Discharge: 2019-04-11 | Disposition: A | Discharge status: Home / Self Care | Payer: BC Managed Care – PPO | Source: Ambulatory Visit | Attending: Obstetrics and Gynecology | Admitting: Obstetrics and Gynecology

## 2019-04-11 DIAGNOSIS — Z20822 Contact with and (suspected) exposure to covid-19: Secondary | ICD-10-CM | POA: Diagnosis not present

## 2019-04-11 DIAGNOSIS — Z01812 Encounter for preprocedural laboratory examination: Secondary | ICD-10-CM | POA: Diagnosis not present

## 2019-04-12 LAB — NOVEL CORONAVIRUS, NAA (HOSP ORDER, SEND-OUT TO REF LAB; TAT 18-24 HRS): SARS-CoV-2, NAA: NOT DETECTED

## 2019-04-13 NOTE — Anesthesia Preprocedure Evaluation (Addendum)
Anesthesia Evaluation  Patient identified by MRN, date of birth, ID band Patient awake    Reviewed: Allergy & Precautions, NPO status , Patient's Chart, lab work & pertinent test results  History of Anesthesia Complications (+) PONV and history of anesthetic complications  Airway Mallampati: I  TM Distance: >3 FB Neck ROM: Full    Dental no notable dental hx.    Pulmonary neg pulmonary ROS,    Pulmonary exam normal        Cardiovascular negative cardio ROS Normal cardiovascular exam     Neuro/Psych ADHDnegative neurological ROS     GI/Hepatic negative GI ROS, Neg liver ROS,   Endo/Other  negative endocrine ROS  Renal/GU negative Renal ROS  negative genitourinary   Musculoskeletal negative musculoskeletal ROS (+)   Abdominal   Peds  Hematology negative hematology ROS (+)   Anesthesia Other Findings Day of surgery medications reviewed with patient.  Reproductive/Obstetrics Retained POC s/p 22w delivery 02/2019                            Anesthesia Physical Anesthesia Plan  ASA: II  Anesthesia Plan: General   Post-op Pain Management:    Induction: Intravenous  PONV Risk Score and Plan: 4 or greater and Treatment may vary due to age or medical condition, Ondansetron, Dexamethasone, Midazolam, Scopolamine patch - Pre-op, Propofol infusion and TIVA  Airway Management Planned: LMA  Additional Equipment: None  Intra-op Plan:   Post-operative Plan: Extubation in OR  Informed Consent: I have reviewed the patients History and Physical, chart, labs and discussed the procedure including the risks, benefits and alternatives for the proposed anesthesia with the patient or authorized representative who has indicated his/her understanding and acceptance.     Dental advisory given  Plan Discussed with: CRNA  Anesthesia Plan Comments:        Anesthesia Quick Evaluation

## 2019-04-14 ENCOUNTER — Ambulatory Visit (HOSPITAL_BASED_OUTPATIENT_CLINIC_OR_DEPARTMENT_OTHER): Payer: BC Managed Care – PPO | Admitting: Anesthesiology

## 2019-04-14 ENCOUNTER — Encounter (HOSPITAL_BASED_OUTPATIENT_CLINIC_OR_DEPARTMENT_OTHER): Payer: Self-pay | Admitting: Obstetrics and Gynecology

## 2019-04-14 ENCOUNTER — Other Ambulatory Visit: Payer: Self-pay

## 2019-04-14 ENCOUNTER — Ambulatory Visit (HOSPITAL_COMMUNITY)
Admission: RE | Admit: 2019-04-14 | Discharge: 2019-04-14 | Disposition: A | Discharge status: Home / Self Care | Payer: BC Managed Care – PPO | Source: Ambulatory Visit | Attending: Obstetrics and Gynecology | Admitting: Obstetrics and Gynecology

## 2019-04-14 ENCOUNTER — Encounter (HOSPITAL_BASED_OUTPATIENT_CLINIC_OR_DEPARTMENT_OTHER)
Admission: RE | Disposition: A | Discharge status: Home / Self Care | Payer: Self-pay | Source: Home / Self Care | Attending: Obstetrics and Gynecology

## 2019-04-14 ENCOUNTER — Ambulatory Visit (HOSPITAL_BASED_OUTPATIENT_CLINIC_OR_DEPARTMENT_OTHER)
Admission: RE | Admit: 2019-04-14 | Discharge: 2019-04-14 | Disposition: A | Discharge status: Home / Self Care | Payer: BC Managed Care – PPO | Attending: Obstetrics and Gynecology | Admitting: Obstetrics and Gynecology

## 2019-04-14 DIAGNOSIS — D219 Benign neoplasm of connective and other soft tissue, unspecified: Secondary | ICD-10-CM

## 2019-04-14 DIAGNOSIS — D259 Leiomyoma of uterus, unspecified: Secondary | ICD-10-CM | POA: Insufficient documentation

## 2019-04-14 DIAGNOSIS — R002 Palpitations: Secondary | ICD-10-CM | POA: Diagnosis not present

## 2019-04-14 DIAGNOSIS — Z880 Allergy status to penicillin: Secondary | ICD-10-CM | POA: Insufficient documentation

## 2019-04-14 DIAGNOSIS — O034 Incomplete spontaneous abortion without complication: Secondary | ICD-10-CM | POA: Diagnosis not present

## 2019-04-14 DIAGNOSIS — F909 Attention-deficit hyperactivity disorder, unspecified type: Secondary | ICD-10-CM | POA: Insufficient documentation

## 2019-04-14 DIAGNOSIS — O089 Unspecified complication following an ectopic and molar pregnancy: Secondary | ICD-10-CM | POA: Insufficient documentation

## 2019-04-14 DIAGNOSIS — Z881 Allergy status to other antibiotic agents status: Secondary | ICD-10-CM | POA: Diagnosis not present

## 2019-04-14 DIAGNOSIS — O021 Missed abortion: Secondary | ICD-10-CM | POA: Diagnosis not present

## 2019-04-14 HISTORY — DX: Nausea with vomiting, unspecified: Z98.890

## 2019-04-14 HISTORY — DX: Personal history of other specified conditions: Z87.898

## 2019-04-14 HISTORY — PX: DILATION AND EVACUATION: SHX1459

## 2019-04-14 HISTORY — DX: Nausea with vomiting, unspecified: R11.2

## 2019-04-14 HISTORY — PX: OPERATIVE ULTRASOUND: SHX5996

## 2019-04-14 HISTORY — DX: Personal history of pre-term labor: Z87.51

## 2019-04-14 LAB — TYPE AND SCREEN
ABO/RH(D): O POS
Antibody Screen: NEGATIVE

## 2019-04-14 LAB — ABO/RH: ABO/RH(D): O POS

## 2019-04-14 SURGERY — DILATION AND EVACUATION, UTERUS
Anesthesia: General | Site: Vagina

## 2019-04-14 MED ORDER — FENTANYL CITRATE (PF) 100 MCG/2ML IJ SOLN
25.0000 ug | INTRAMUSCULAR | Status: DC | PRN
  Filled 2019-04-14: qty 1

## 2019-04-14 MED ORDER — SCOPOLAMINE 1 MG/3DAYS TD PT72
1.0000 | MEDICATED_PATCH | Freq: Once | TRANSDERMAL | Status: DC
  Administered 2019-04-14: 1.5 mg via TRANSDERMAL
  Filled 2019-04-14: qty 1

## 2019-04-14 MED ORDER — PROPOFOL 500 MG/50ML IV EMUL
INTRAVENOUS | Status: AC
  Filled 2019-04-14: qty 50

## 2019-04-14 MED ORDER — LIDOCAINE HCL (CARDIAC) PF 100 MG/5ML IV SOSY
PREFILLED_SYRINGE | INTRAVENOUS | Status: DC | PRN
  Administered 2019-04-14: 40 mg via INTRAVENOUS
  Administered 2019-04-14: 20 mg via INTRAVENOUS

## 2019-04-14 MED ORDER — OXYCODONE HCL 5 MG PO TABS
5.0000 mg | ORAL_TABLET | Freq: Once | ORAL | Status: DC | PRN
  Filled 2019-04-14: qty 1

## 2019-04-14 MED ORDER — MIDAZOLAM HCL 2 MG/2ML IJ SOLN
INTRAMUSCULAR | Status: AC
  Filled 2019-04-14: qty 2

## 2019-04-14 MED ORDER — OXYCODONE HCL 5 MG/5ML PO SOLN
5.0000 mg | Freq: Once | ORAL | Status: DC | PRN
  Filled 2019-04-14: qty 5

## 2019-04-14 MED ORDER — SCOPOLAMINE 1 MG/3DAYS TD PT72
MEDICATED_PATCH | TRANSDERMAL | Status: AC
  Filled 2019-04-14: qty 1

## 2019-04-14 MED ORDER — PROPOFOL 10 MG/ML IV BOLUS
INTRAVENOUS | Status: DC | PRN
  Administered 2019-04-14: 130 mg via INTRAVENOUS
  Administered 2019-04-14: 50 mg via INTRAVENOUS

## 2019-04-14 MED ORDER — FENTANYL CITRATE (PF) 100 MCG/2ML IJ SOLN
INTRAMUSCULAR | Status: DC | PRN
  Administered 2019-04-14: 50 ug via INTRAVENOUS

## 2019-04-14 MED ORDER — LACTATED RINGERS IV SOLN
INTRAVENOUS | Status: DC
  Filled 2019-04-14: qty 1000

## 2019-04-14 MED ORDER — DEXAMETHASONE SODIUM PHOSPHATE 10 MG/ML IJ SOLN
INTRAMUSCULAR | Status: AC
  Filled 2019-04-14: qty 1

## 2019-04-14 MED ORDER — CHLOROPROCAINE HCL 1 % IJ SOLN
INTRAMUSCULAR | Status: DC | PRN
  Administered 2019-04-14: 8 mL

## 2019-04-14 MED ORDER — KETOROLAC TROMETHAMINE 30 MG/ML IJ SOLN
INTRAMUSCULAR | Status: AC
  Filled 2019-04-14: qty 1

## 2019-04-14 MED ORDER — ONDANSETRON HCL 4 MG/2ML IJ SOLN
INTRAMUSCULAR | Status: DC | PRN
  Administered 2019-04-14: 4 mg via INTRAVENOUS

## 2019-04-14 MED ORDER — DEXAMETHASONE SODIUM PHOSPHATE 4 MG/ML IJ SOLN
INTRAMUSCULAR | Status: DC | PRN
  Administered 2019-04-14: 10 mg via INTRAVENOUS

## 2019-04-14 MED ORDER — ACETAMINOPHEN 500 MG PO TABS
ORAL_TABLET | ORAL | Status: AC
  Filled 2019-04-14: qty 2

## 2019-04-14 MED ORDER — ACETAMINOPHEN 500 MG PO TABS
1000.0000 mg | ORAL_TABLET | Freq: Once | ORAL | Status: AC
  Administered 2019-04-14: 1000 mg via ORAL
  Filled 2019-04-14: qty 2

## 2019-04-14 MED ORDER — MIDAZOLAM HCL 5 MG/5ML IJ SOLN
INTRAMUSCULAR | Status: DC | PRN
  Administered 2019-04-14: 2 mg via INTRAVENOUS

## 2019-04-14 MED ORDER — PROMETHAZINE HCL 25 MG/ML IJ SOLN
6.2500 mg | INTRAMUSCULAR | Status: DC | PRN
  Filled 2019-04-14: qty 1

## 2019-04-14 MED ORDER — ONDANSETRON HCL 4 MG/2ML IJ SOLN
INTRAMUSCULAR | Status: AC
  Filled 2019-04-14: qty 2

## 2019-04-14 MED ORDER — FENTANYL CITRATE (PF) 100 MCG/2ML IJ SOLN
INTRAMUSCULAR | Status: AC
  Filled 2019-04-14: qty 2

## 2019-04-14 MED ORDER — MISOPROSTOL 200 MCG PO TABS: 200.0000 ug | ORAL_TABLET | Freq: Two times a day (BID) | ORAL | 0 refills | Status: DC

## 2019-04-14 MED ORDER — KETOROLAC TROMETHAMINE 30 MG/ML IJ SOLN
INTRAMUSCULAR | Status: DC | PRN
  Administered 2019-04-14: 30 mg via INTRAVENOUS

## 2019-04-14 MED ORDER — LIDOCAINE 2% (20 MG/ML) 5 ML SYRINGE
INTRAMUSCULAR | Status: AC
  Filled 2019-04-14: qty 5

## 2019-04-14 MED ORDER — PROPOFOL 500 MG/50ML IV EMUL
INTRAVENOUS | Status: DC | PRN
  Administered 2019-04-14: 175 ug via INTRAVENOUS

## 2019-04-14 SURGICAL SUPPLY — 27 items
CATH ROBINSON RED A/P 16FR (CATHETERS) ×2 IMPLANT
DECANTER SPIKE VIAL GLASS SM (MISCELLANEOUS) ×2 IMPLANT
GLOVE BIO SURGEON STRL SZ 6.5 (GLOVE) ×2 IMPLANT
GLOVE BIOGEL PI IND STRL 7.0 (GLOVE) ×2 IMPLANT
GLOVE BIOGEL PI IND STRL 7.5 (GLOVE) ×1 IMPLANT
GLOVE BIOGEL PI IND STRL 8.5 (GLOVE) ×1 IMPLANT
GLOVE BIOGEL PI INDICATOR 7.0 (GLOVE) ×2
GLOVE BIOGEL PI INDICATOR 7.5 (GLOVE) ×1
GLOVE BIOGEL PI INDICATOR 8.5 (GLOVE) ×1
GLOVE INDICATOR 8.5 STRL (GLOVE) ×2 IMPLANT
GOWN STRL REUS W/ TWL LRG LVL3 (GOWN DISPOSABLE) ×2 IMPLANT
GOWN STRL REUS W/TWL LRG LVL3 (GOWN DISPOSABLE) ×2
HIBICLENS CHG 4% 4OZ (MISCELLANEOUS) IMPLANT
KIT BERKELEY 1ST TRI 3/8 NO TR (MISCELLANEOUS) ×2 IMPLANT
KIT BERKELEY 1ST TRIMESTER 3/8 (MISCELLANEOUS) ×2 IMPLANT
NS IRRIG 1000ML POUR BTL (IV SOLUTION) IMPLANT
NS IRRIG 500ML POUR BTL (IV SOLUTION) ×2 IMPLANT
PACK VAGINAL MINOR WOMEN LF (CUSTOM PROCEDURE TRAY) ×2 IMPLANT
PAD OB MATERNITY 4.3X12.25 (PERSONAL CARE ITEMS) ×2 IMPLANT
PAD PREP 24X48 CUFFED NSTRL (MISCELLANEOUS) ×2 IMPLANT
SET BERKELEY SUCTION TUBING (SUCTIONS) ×2 IMPLANT
TOWEL OR 17X24 6PK STRL BLUE (TOWEL DISPOSABLE) ×4 IMPLANT
VACURETTE 10 RIGID CVD (CANNULA) IMPLANT
VACURETTE 12 RIGID CVD (CANNULA) ×2 IMPLANT
VACURETTE 7MM CVD STRL WRAP (CANNULA) IMPLANT
VACURETTE 8 RIGID CVD (CANNULA) ×2 IMPLANT
VACURETTE 9 RIGID CVD (CANNULA) IMPLANT

## 2019-04-14 NOTE — Anesthesia Procedure Notes (Signed)
Procedure Name: LMA Insertion Date/Time: 04/14/2019 7:32 AM Performed by: Bufford Spikes, CRNA Pre-anesthesia Checklist: Patient identified, Emergency Drugs available, Suction available and Patient being monitored Patient Re-evaluated:Patient Re-evaluated prior to induction Oxygen Delivery Method: Circle system utilized Preoxygenation: Pre-oxygenation with 100% oxygen Induction Type: IV induction Ventilation: Mask ventilation without difficulty LMA: LMA inserted LMA Size: 4.0 Number of attempts: 1 Airway Equipment and Method: Bite block Placement Confirmation: positive ETCO2 Tube secured with: Tape Dental Injury: Teeth and Oropharynx as per pre-operative assessment

## 2019-04-14 NOTE — Discharge Instructions (Signed)
  Post Anesthesia Home Care Instructions  Activity: Get plenty of rest for the remainder of the day. A responsible adult should stay with you for 24 hours following the procedure.  For the next 24 hours, DO NOT: -Drive a car -Operate machinery -Drink alcoholic beverages -Take any medication unless instructed by your physician -Make any legal decisions or sign important papers.  Meals: Start with liquid foods such as gelatin or soup. Progress to regular foods as tolerated. Avoid greasy, spicy, heavy foods. If nausea and/or vomiting occur, drink only clear liquids until the nausea and/or vomiting subsides. Call your physician if vomiting continues.  Special Instructions/Symptoms: Your throat may feel dry or sore from the anesthesia or the breathing tube placed in your throat during surgery. If this causes discomfort, gargle with warm salt water. The discomfort should disappear within 24 hours.  If you had a scopolamine patch placed behind your ear for the management of post- operative nausea and/or vomiting:  1. The medication in the patch is effective for 72 hours, after which it should be removed.  Wrap patch in a tissue and discard in the trash. Wash hands thoroughly with soap and water. 2. You may remove the patch earlier than 72 hours if you experience unpleasant side effects which may include dry mouth, dizziness or visual disturbances. 3. Avoid touching the patch. Wash your hands with soap and water after contact with the patch.     D & C Home care Instructions:   Personal hygiene:  Used sanitary napkins for vaginal drainage not tampons. Shower or tub bathe the day after your procedure. No douching until bleeding stops. Always wipe from front to back after  Elimination.  Activity: Do not drive or operate any equipment today. The effects of the anesthesia are still present and drowsiness may result. Rest today, not necessarily flat bed rest, just take it easy. You may resume your  normal activity in one to 2 days.  Sexual activity: No intercourse for one week or as indicated by your physician  Diet: Eat a light diet as desired this evening. You may resume a regular diet tomorrow.  Return to work: One to 2 days.  General Expectations of your surgery: Vaginal bleeding should be no heavier than a normal period. Spotting may continue up to 10 days. Mild cramps may continue for a couple of days. You may have a regular period in 2-6 weeks.  Unexpected observations call your doctor if these occur: persistent or heavy bleeding. Severe abdominal cramping or pain. Elevation of temperature greater than 100F.  Call for an appointment in one week.  

## 2019-04-14 NOTE — Transfer of Care (Signed)
Immediate Anesthesia Transfer of Care Note  Patient: Heather Herrera  Procedure(s) Performed: DILATATION AND EVACUATION (N/A Vagina ) OPERATIVE ULTRASOUND (N/A Vagina )  Patient Location: PACU  Anesthesia Type:General  Level of Consciousness: awake, alert  and oriented  Airway & Oxygen Therapy: Patient Spontanous Breathing and Patient connected to nasal cannula oxygen  Post-op Assessment: Report given to RN and Post -op Vital signs reviewed and stable  Post vital signs: Reviewed and stable  Last Vitals:  Vitals Value Taken Time  BP 112/83 04/14/19 0810  Temp 36.6 C 04/14/19 0811  Pulse 87 04/14/19 0813  Resp 11 04/14/19 0813  SpO2 100 % 04/14/19 0813  Vitals shown include unvalidated device data.  Last Pain:  Vitals:   04/14/19 0600  TempSrc:   PainSc: 0-No pain      Patients Stated Pain Goal: 5 ([id number])  Complications: No apparent anesthesia complications

## 2019-04-14 NOTE — Anesthesia Postprocedure Evaluation (Signed)
Anesthesia Post Note  Patient: Heather Herrera  Procedure(s) Performed: DILATATION AND EVACUATION (N/A Vagina ) OPERATIVE ULTRASOUND (N/A Vagina )     Patient location during evaluation: PACU Anesthesia Type: General Level of consciousness: awake and alert and oriented Pain management: pain level controlled Vital Signs Assessment: post-procedure vital signs reviewed and stable Respiratory status: spontaneous breathing, nonlabored ventilation and respiratory function stable Cardiovascular status: blood pressure returned to baseline Postop Assessment: no apparent nausea or vomiting Anesthetic complications: no    Last Vitals:  Vitals:   04/14/19 0811 04/14/19 0815  BP: 112/83 110/76  Pulse: 82 74  Resp: 11 10  Temp: 36.6 C   SpO2: 99% 100%    Last Pain:  Vitals:   04/14/19 0811  TempSrc:   PainSc: [place]

## 2019-04-14 NOTE — Progress Notes (Signed)
No updates to above H&P. Patient arrived NPO and was consented in PACU. Risks again discussed, all questions answered, and consent signed. Proceed with above surgery.    Jamonica Schoff MD  

## 2019-04-14 NOTE — Op Note (Signed)
PREOPERATIVE DIAGNOSES: 1. Missed Abortion  POSTOPERATIVE DIAGNOSES: Same  PROCEDURE PERFORMED: Dilation, suction, sharp curretage  SURGEON: Dr. Lucillie Garfinkel  ANESTHESIA: Paracervical block and IV sedation  ESTIMATED BLOOD LOSS: 50cc.  COMPLICATIONS: None  TUBES: None.  DRAINS: None  PATHOLOGY: Endometrial curettings and placental tissue  FINDINGS: On exam, under anesthesia, normal appearing vulva and vagina, 16 week sized uterus.   Operative findings demonstrated ~3cm retained products. Please see [place] report.  TVUS guidance showed no evidence rPOC upon completion  Procedure: The patient was taken to the operating room where she was properly prepped and draped in sterile manner under general anesthesia. After bimanual examination, the cervix was exposed with a speculum and the anterior lip of the cervix grasped with a tenaculum. Paracervical block performed. The endocervical canal was then progressively dilated to 40mm under US guidance ensuring no perforation. Suction catheter was introduced into the uterus and to the uterine fundus. The uterus was evacuated and good tissue return was noted. However, US demonstrated retained tissue. Ring forceps used under direct observation with [place] to extract a ~3cm portion of rPOC. A sharp curettage was then performed until gritty texture noted. TVUS was performed and noted complete evacuation with normal EMS and no evidence of rPOCs. All instruments were removed from vagina. The sponge and lap counts were correct times 2 at this time. The patient's procedure was terminated. We then awakened her. She was sent to the Recovery Room in good condition.    Lucillie Garfinkel MD

## 2019-04-17 LAB — SURGICAL PATHOLOGY

## 2019-04-26 DIAGNOSIS — N96 Recurrent pregnancy loss: Secondary | ICD-10-CM | POA: Diagnosis not present

## 2019-05-17 DIAGNOSIS — D259 Leiomyoma of uterus, unspecified: Secondary | ICD-10-CM | POA: Diagnosis not present

## 2019-05-17 DIAGNOSIS — N96 Recurrent pregnancy loss: Secondary | ICD-10-CM | POA: Diagnosis not present

## 2019-06-26 DIAGNOSIS — F900 Attention-deficit hyperactivity disorder, predominantly inattentive type: Secondary | ICD-10-CM | POA: Diagnosis not present

## 2019-07-07 DIAGNOSIS — N926 Irregular menstruation, unspecified: Secondary | ICD-10-CM | POA: Diagnosis not present

## 2019-07-07 DIAGNOSIS — Z8751 Personal history of pre-term labor: Secondary | ICD-10-CM | POA: Diagnosis not present

## 2019-07-07 DIAGNOSIS — D259 Leiomyoma of uterus, unspecified: Secondary | ICD-10-CM | POA: Diagnosis not present

## 2019-07-07 DIAGNOSIS — N96 Recurrent pregnancy loss: Secondary | ICD-10-CM | POA: Diagnosis not present

## 2019-07-21 DIAGNOSIS — Z139 Encounter for screening, unspecified: Secondary | ICD-10-CM | POA: Diagnosis not present

## 2019-07-21 DIAGNOSIS — Z319 Encounter for procreative management, unspecified: Secondary | ICD-10-CM | POA: Diagnosis not present

## 2019-08-01 DIAGNOSIS — D251 Intramural leiomyoma of uterus: Secondary | ICD-10-CM | POA: Diagnosis not present

## 2019-08-17 DIAGNOSIS — N979 Female infertility, unspecified: Secondary | ICD-10-CM | POA: Diagnosis not present

## 2019-08-22 DIAGNOSIS — N979 Female infertility, unspecified: Secondary | ICD-10-CM | POA: Diagnosis not present

## 2019-08-28 DIAGNOSIS — L603 Nail dystrophy: Secondary | ICD-10-CM | POA: Diagnosis not present

## 2019-08-28 DIAGNOSIS — L989 Disorder of the skin and subcutaneous tissue, unspecified: Secondary | ICD-10-CM | POA: Diagnosis not present

## 2019-08-28 DIAGNOSIS — L578 Other skin changes due to chronic exposure to nonionizing radiation: Secondary | ICD-10-CM | POA: Diagnosis not present

## 2019-08-28 DIAGNOSIS — D2271 Melanocytic nevi of right lower limb, including hip: Secondary | ICD-10-CM | POA: Diagnosis not present

## 2019-09-15 DIAGNOSIS — N979 Female infertility, unspecified: Secondary | ICD-10-CM | POA: Diagnosis not present

## 2019-09-26 DIAGNOSIS — N939 Abnormal uterine and vaginal bleeding, unspecified: Secondary | ICD-10-CM | POA: Diagnosis not present

## 2019-09-26 DIAGNOSIS — Z319 Encounter for procreative management, unspecified: Secondary | ICD-10-CM | POA: Diagnosis not present

## 2019-09-26 DIAGNOSIS — N760 Acute vaginitis: Secondary | ICD-10-CM | POA: Diagnosis not present

## 2019-09-26 DIAGNOSIS — N898 Other specified noninflammatory disorders of vagina: Secondary | ICD-10-CM | POA: Diagnosis not present

## 2019-10-30 DIAGNOSIS — N926 Irregular menstruation, unspecified: Secondary | ICD-10-CM | POA: Diagnosis not present

## 2019-11-01 ENCOUNTER — Other Ambulatory Visit: Payer: Self-pay | Admitting: Obstetrics and Gynecology

## 2019-11-01 ENCOUNTER — Other Ambulatory Visit (HOSPITAL_COMMUNITY): Payer: Self-pay | Admitting: Obstetrics and Gynecology

## 2019-11-01 DIAGNOSIS — N926 Irregular menstruation, unspecified: Secondary | ICD-10-CM

## 2019-11-01 DIAGNOSIS — Z9889 Other specified postprocedural states: Secondary | ICD-10-CM | POA: Diagnosis not present

## 2019-11-24 DIAGNOSIS — N979 Female infertility, unspecified: Secondary | ICD-10-CM | POA: Diagnosis not present

## 2019-12-07 DIAGNOSIS — N912 Amenorrhea, unspecified: Secondary | ICD-10-CM | POA: Diagnosis not present

## 2019-12-07 DIAGNOSIS — N979 Female infertility, unspecified: Secondary | ICD-10-CM | POA: Diagnosis not present

## 2019-12-16 DIAGNOSIS — Z23 Encounter for immunization: Secondary | ICD-10-CM | POA: Diagnosis not present

## 2020-01-08 ENCOUNTER — Other Ambulatory Visit: Payer: Self-pay | Admitting: Obstetrics and Gynecology

## 2020-01-08 DIAGNOSIS — N979 Female infertility, unspecified: Secondary | ICD-10-CM

## 2020-01-15 ENCOUNTER — Ambulatory Visit
Admission: RE | Admit: 2020-01-15 | Discharge: 2020-01-15 | Disposition: A | Discharge status: Home / Self Care | Payer: BC Managed Care – PPO | Source: Ambulatory Visit | Attending: Obstetrics and Gynecology | Admitting: Obstetrics and Gynecology

## 2020-01-15 DIAGNOSIS — Z3141 Encounter for fertility testing: Secondary | ICD-10-CM | POA: Diagnosis not present

## 2020-01-15 DIAGNOSIS — N979 Female infertility, unspecified: Secondary | ICD-10-CM

## 2020-01-19 DIAGNOSIS — N979 Female infertility, unspecified: Secondary | ICD-10-CM | POA: Diagnosis not present

## 2020-01-19 DIAGNOSIS — D259 Leiomyoma of uterus, unspecified: Secondary | ICD-10-CM | POA: Diagnosis not present

## 2020-01-19 DIAGNOSIS — Q519 Congenital malformation of uterus and cervix, unspecified: Secondary | ICD-10-CM | POA: Diagnosis not present

## 2020-01-19 DIAGNOSIS — N96 Recurrent pregnancy loss: Secondary | ICD-10-CM | POA: Diagnosis not present

## 2020-01-22 ENCOUNTER — Other Ambulatory Visit: Payer: Self-pay | Admitting: Obstetrics and Gynecology

## 2020-01-22 DIAGNOSIS — Q519 Congenital malformation of uterus and cervix, unspecified: Secondary | ICD-10-CM

## 2020-02-07 DIAGNOSIS — Z0000 Encounter for general adult medical examination without abnormal findings: Secondary | ICD-10-CM | POA: Diagnosis not present

## 2020-02-07 DIAGNOSIS — Z13220 Encounter for screening for lipoid disorders: Secondary | ICD-10-CM | POA: Diagnosis not present

## 2020-02-07 DIAGNOSIS — Z131 Encounter for screening for diabetes mellitus: Secondary | ICD-10-CM | POA: Diagnosis not present

## 2020-02-07 DIAGNOSIS — F900 Attention-deficit hyperactivity disorder, predominantly inattentive type: Secondary | ICD-10-CM | POA: Diagnosis not present

## 2020-02-10 ENCOUNTER — Ambulatory Visit
Admission: RE | Admit: 2020-02-10 | Discharge: 2020-02-10 | Disposition: A | Discharge status: Home / Self Care | Payer: BC Managed Care – PPO | Source: Ambulatory Visit | Attending: Obstetrics and Gynecology | Admitting: Obstetrics and Gynecology

## 2020-02-10 ENCOUNTER — Other Ambulatory Visit: Payer: Self-pay

## 2020-02-10 DIAGNOSIS — D251 Intramural leiomyoma of uterus: Secondary | ICD-10-CM | POA: Diagnosis not present

## 2020-02-10 DIAGNOSIS — Q519 Congenital malformation of uterus and cervix, unspecified: Secondary | ICD-10-CM

## 2020-02-10 DIAGNOSIS — N858 Other specified noninflammatory disorders of uterus: Secondary | ICD-10-CM | POA: Diagnosis not present

## 2020-02-10 DIAGNOSIS — N979 Female infertility, unspecified: Secondary | ICD-10-CM | POA: Diagnosis not present

## 2020-02-10 MED ORDER — GADOBENATE DIMEGLUMINE 529 MG/ML IV SOLN
12.0000 mL | Freq: Once | INTRAVENOUS | Status: AC | PRN
  Administered 2020-02-10: 12 mL via INTRAVENOUS

## 2020-02-29 DIAGNOSIS — Z319 Encounter for procreative management, unspecified: Secondary | ICD-10-CM | POA: Diagnosis not present

## 2020-02-29 DIAGNOSIS — Z31430 Encounter of female for testing for genetic disease carrier status for procreative management: Secondary | ICD-10-CM | POA: Diagnosis not present

## 2020-02-29 DIAGNOSIS — Z3189 Encounter for other procreative management: Secondary | ICD-10-CM | POA: Diagnosis not present

## 2020-03-06 DIAGNOSIS — Z3189 Encounter for other procreative management: Secondary | ICD-10-CM | POA: Diagnosis not present

## 2020-03-09 DIAGNOSIS — D251 Intramural leiomyoma of uterus: Secondary | ICD-10-CM | POA: Diagnosis not present

## 2020-03-09 DIAGNOSIS — Z3189 Encounter for other procreative management: Secondary | ICD-10-CM | POA: Diagnosis not present

## 2020-03-09 DIAGNOSIS — N970 Female infertility associated with anovulation: Secondary | ICD-10-CM | POA: Diagnosis not present

## 2020-03-09 DIAGNOSIS — N96 Recurrent pregnancy loss: Secondary | ICD-10-CM | POA: Diagnosis not present

## 2020-03-09 DIAGNOSIS — E288 Other ovarian dysfunction: Secondary | ICD-10-CM | POA: Diagnosis not present

## 2020-03-09 DIAGNOSIS — Z319 Encounter for procreative management, unspecified: Secondary | ICD-10-CM | POA: Diagnosis not present

## 2020-03-11 DIAGNOSIS — N970 Female infertility associated with anovulation: Secondary | ICD-10-CM | POA: Diagnosis not present

## 2020-03-11 DIAGNOSIS — Z3189 Encounter for other procreative management: Secondary | ICD-10-CM | POA: Diagnosis not present

## 2020-03-11 DIAGNOSIS — E288 Other ovarian dysfunction: Secondary | ICD-10-CM | POA: Diagnosis not present

## 2020-03-11 DIAGNOSIS — D251 Intramural leiomyoma of uterus: Secondary | ICD-10-CM | POA: Diagnosis not present

## 2020-03-11 DIAGNOSIS — N96 Recurrent pregnancy loss: Secondary | ICD-10-CM | POA: Diagnosis not present

## 2020-03-13 DIAGNOSIS — N96 Recurrent pregnancy loss: Secondary | ICD-10-CM | POA: Diagnosis not present

## 2020-03-13 DIAGNOSIS — D251 Intramural leiomyoma of uterus: Secondary | ICD-10-CM | POA: Diagnosis not present

## 2020-03-13 DIAGNOSIS — Z3189 Encounter for other procreative management: Secondary | ICD-10-CM | POA: Diagnosis not present

## 2020-03-13 DIAGNOSIS — N970 Female infertility associated with anovulation: Secondary | ICD-10-CM | POA: Diagnosis not present

## 2020-03-13 DIAGNOSIS — E288 Other ovarian dysfunction: Secondary | ICD-10-CM | POA: Diagnosis not present

## 2020-03-14 DIAGNOSIS — Z3189 Encounter for other procreative management: Secondary | ICD-10-CM | POA: Diagnosis not present

## 2020-03-26 DIAGNOSIS — N970 Female infertility associated with anovulation: Secondary | ICD-10-CM | POA: Diagnosis not present

## 2020-03-26 DIAGNOSIS — N83202 Unspecified ovarian cyst, left side: Secondary | ICD-10-CM | POA: Diagnosis not present

## 2020-03-26 DIAGNOSIS — E288 Other ovarian dysfunction: Secondary | ICD-10-CM | POA: Diagnosis not present

## 2020-03-26 DIAGNOSIS — D251 Intramural leiomyoma of uterus: Secondary | ICD-10-CM | POA: Diagnosis not present

## 2020-03-26 DIAGNOSIS — N96 Recurrent pregnancy loss: Secondary | ICD-10-CM | POA: Diagnosis not present

## 2020-03-26 DIAGNOSIS — N83201 Unspecified ovarian cyst, right side: Secondary | ICD-10-CM | POA: Diagnosis not present

## 2020-04-03 DIAGNOSIS — N970 Female infertility associated with anovulation: Secondary | ICD-10-CM | POA: Diagnosis not present

## 2020-04-03 DIAGNOSIS — E288 Other ovarian dysfunction: Secondary | ICD-10-CM | POA: Diagnosis not present

## 2020-04-03 DIAGNOSIS — D251 Intramural leiomyoma of uterus: Secondary | ICD-10-CM | POA: Diagnosis not present

## 2020-04-03 DIAGNOSIS — Z319 Encounter for procreative management, unspecified: Secondary | ICD-10-CM | POA: Diagnosis not present

## 2020-04-03 DIAGNOSIS — N96 Recurrent pregnancy loss: Secondary | ICD-10-CM | POA: Diagnosis not present

## 2020-04-12 DIAGNOSIS — Z3189 Encounter for other procreative management: Secondary | ICD-10-CM | POA: Diagnosis not present

## 2020-04-12 DIAGNOSIS — E288 Other ovarian dysfunction: Secondary | ICD-10-CM | POA: Diagnosis not present

## 2020-04-12 DIAGNOSIS — N96 Recurrent pregnancy loss: Secondary | ICD-10-CM | POA: Diagnosis not present

## 2020-04-12 DIAGNOSIS — D251 Intramural leiomyoma of uterus: Secondary | ICD-10-CM | POA: Diagnosis not present

## 2020-04-12 DIAGNOSIS — N970 Female infertility associated with anovulation: Secondary | ICD-10-CM | POA: Diagnosis not present

## 2020-04-15 DIAGNOSIS — Z3189 Encounter for other procreative management: Secondary | ICD-10-CM | POA: Diagnosis not present

## 2020-04-26 DIAGNOSIS — N970 Female infertility associated with anovulation: Secondary | ICD-10-CM | POA: Diagnosis not present

## 2020-04-26 DIAGNOSIS — N96 Recurrent pregnancy loss: Secondary | ICD-10-CM | POA: Diagnosis not present

## 2020-04-26 DIAGNOSIS — N83202 Unspecified ovarian cyst, left side: Secondary | ICD-10-CM | POA: Diagnosis not present

## 2020-04-26 DIAGNOSIS — E288 Other ovarian dysfunction: Secondary | ICD-10-CM | POA: Diagnosis not present

## 2020-04-26 DIAGNOSIS — D251 Intramural leiomyoma of uterus: Secondary | ICD-10-CM | POA: Diagnosis not present

## 2020-05-06 DIAGNOSIS — D251 Intramural leiomyoma of uterus: Secondary | ICD-10-CM | POA: Diagnosis not present

## 2020-05-06 DIAGNOSIS — Z113 Encounter for screening for infections with a predominantly sexual mode of transmission: Secondary | ICD-10-CM | POA: Diagnosis not present

## 2020-05-06 DIAGNOSIS — N96 Recurrent pregnancy loss: Secondary | ICD-10-CM | POA: Diagnosis not present

## 2020-05-06 DIAGNOSIS — N970 Female infertility associated with anovulation: Secondary | ICD-10-CM | POA: Diagnosis not present

## 2020-05-06 DIAGNOSIS — Z3141 Encounter for fertility testing: Secondary | ICD-10-CM | POA: Diagnosis not present

## 2020-05-06 DIAGNOSIS — E288 Other ovarian dysfunction: Secondary | ICD-10-CM | POA: Diagnosis not present

## 2020-05-20 DIAGNOSIS — Z113 Encounter for screening for infections with a predominantly sexual mode of transmission: Secondary | ICD-10-CM | POA: Diagnosis not present

## 2020-07-12 DIAGNOSIS — N96 Recurrent pregnancy loss: Secondary | ICD-10-CM | POA: Diagnosis not present

## 2020-07-18 DIAGNOSIS — Z3200 Encounter for pregnancy test, result unknown: Secondary | ICD-10-CM | POA: Diagnosis not present

## 2020-07-18 DIAGNOSIS — Z3201 Encounter for pregnancy test, result positive: Secondary | ICD-10-CM | POA: Diagnosis not present

## 2020-07-19 DIAGNOSIS — N920 Excessive and frequent menstruation with regular cycle: Secondary | ICD-10-CM | POA: Diagnosis not present

## 2020-07-19 DIAGNOSIS — Z881 Allergy status to other antibiotic agents status: Secondary | ICD-10-CM | POA: Diagnosis not present

## 2020-07-19 DIAGNOSIS — N736 Female pelvic peritoneal adhesions (postinfective): Secondary | ICD-10-CM | POA: Diagnosis not present

## 2020-07-19 DIAGNOSIS — N883 Incompetence of cervix uteri: Secondary | ICD-10-CM | POA: Diagnosis not present

## 2020-07-19 DIAGNOSIS — N828 Other female genital tract fistulae: Secondary | ICD-10-CM | POA: Diagnosis not present

## 2020-07-19 DIAGNOSIS — D259 Leiomyoma of uterus, unspecified: Secondary | ICD-10-CM | POA: Diagnosis not present

## 2020-07-19 DIAGNOSIS — D251 Intramural leiomyoma of uterus: Secondary | ICD-10-CM | POA: Diagnosis not present

## 2020-07-19 DIAGNOSIS — Z880 Allergy status to penicillin: Secondary | ICD-10-CM | POA: Diagnosis not present

## 2020-08-03 IMAGING — US US OB COMP LESS 14 WK
1 series · 15 of 28 positions shown · non-contrast
Comparison: None.

CLINICAL DATA: Bleeding

EXAM:
OBSTETRIC <14 WK ULTRASOUND
TECHNIQUE: Transabdominal ultrasound was performed for evaluation of the
gestation as well as the maternal uterus and adnexal regions.

[Series 1: us ob comp less 14 wk · 15 of 44 slices shown]
[im 1/44]
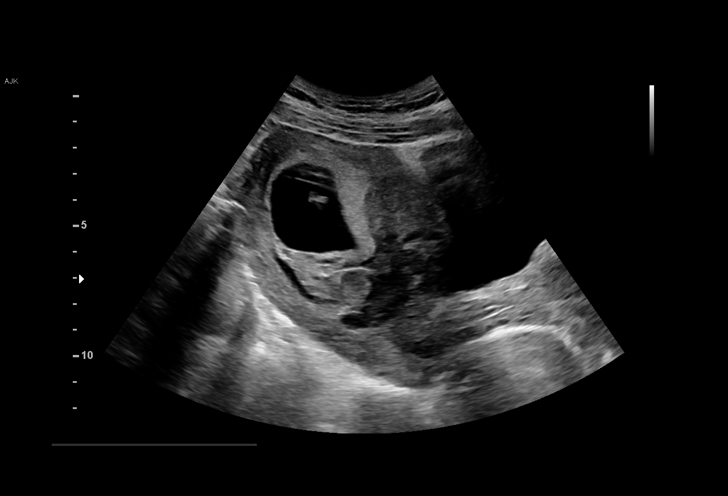
[im 4/44]
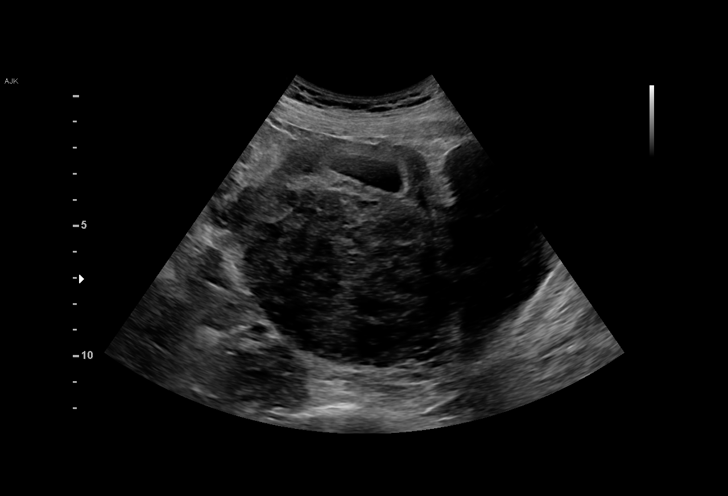
[im 7/44]
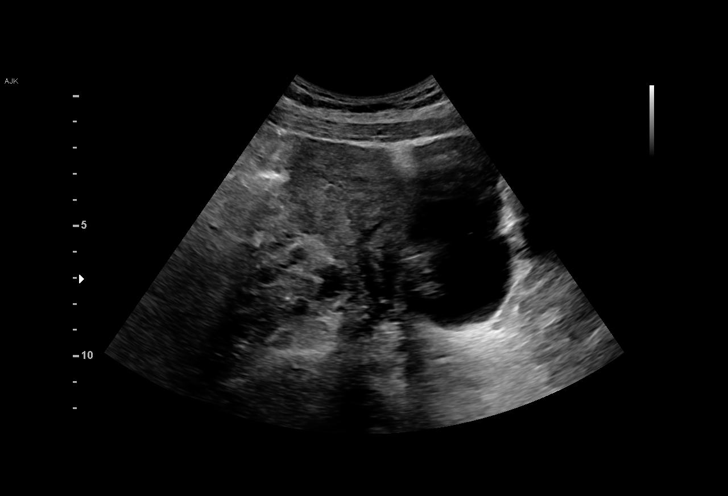
[im 10/44]
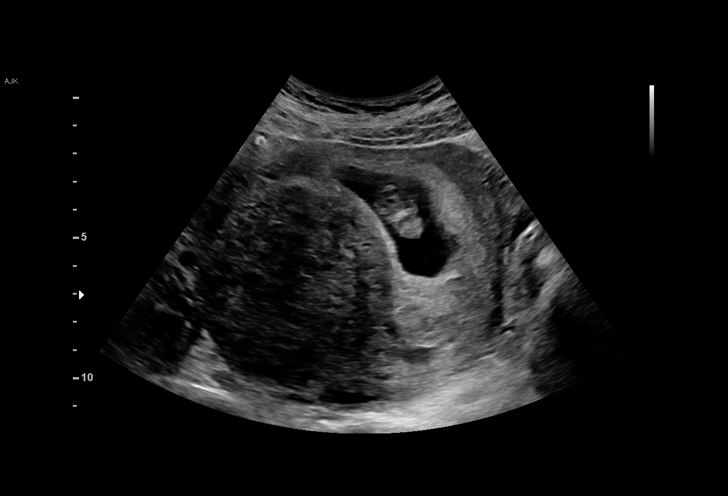
[im 13/44]
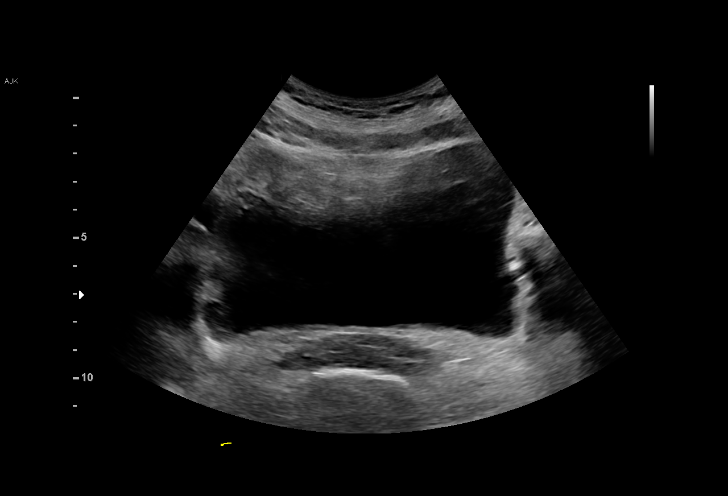
[im 16/44]
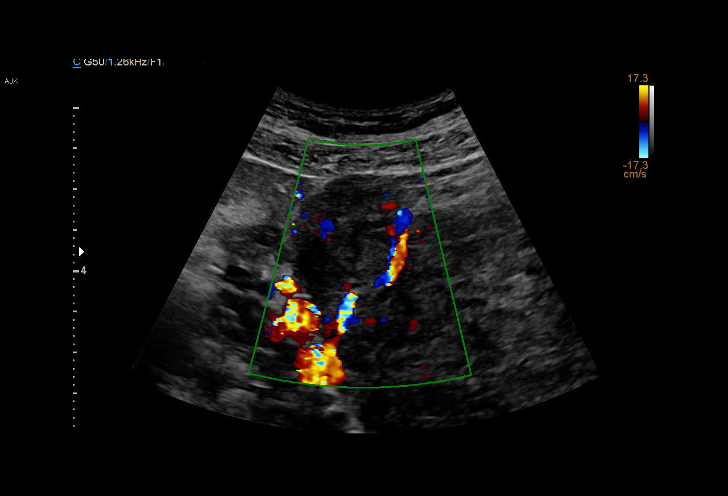
[im 20/44]
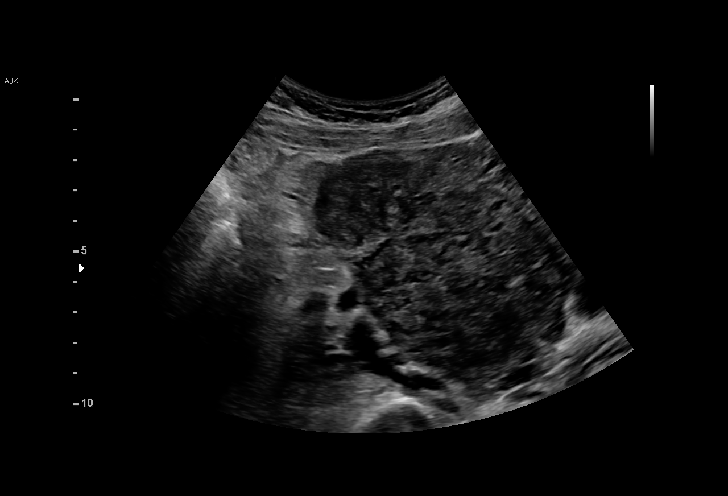
[im 23/44]
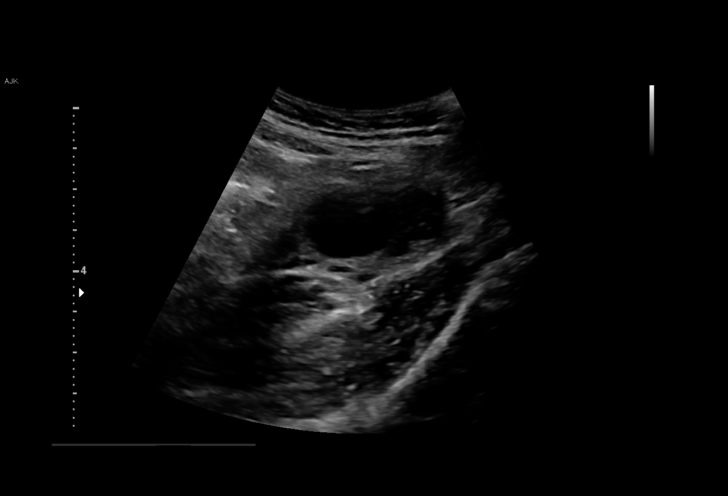
[im 24/44]
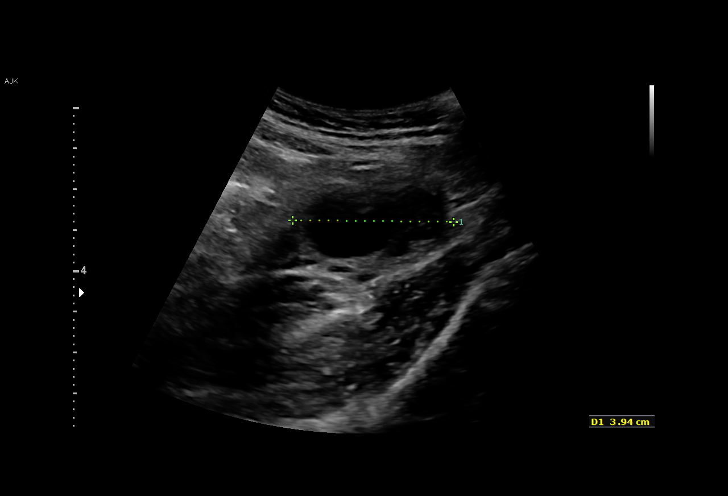
[im 28/44]
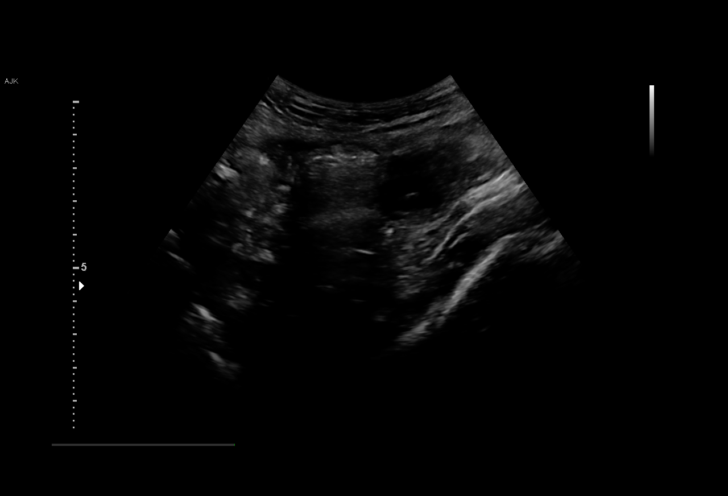
[im 31/44]
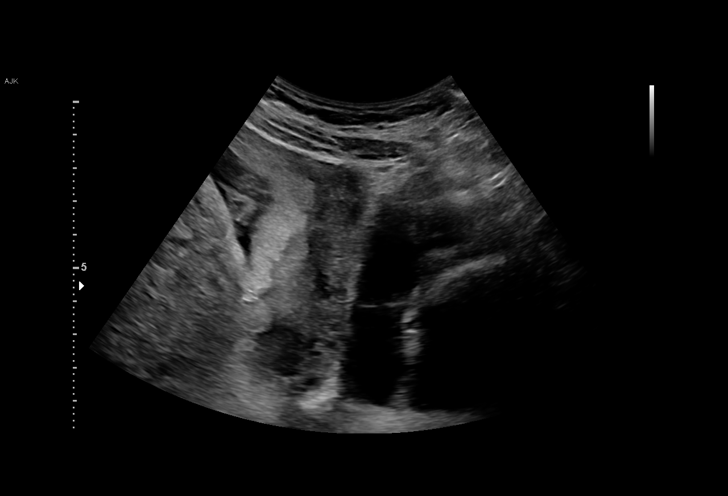
[im 34/44]
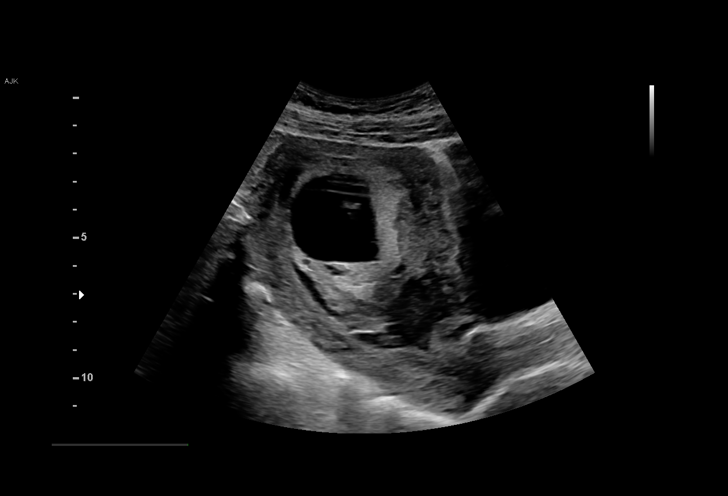
[im 37/44]
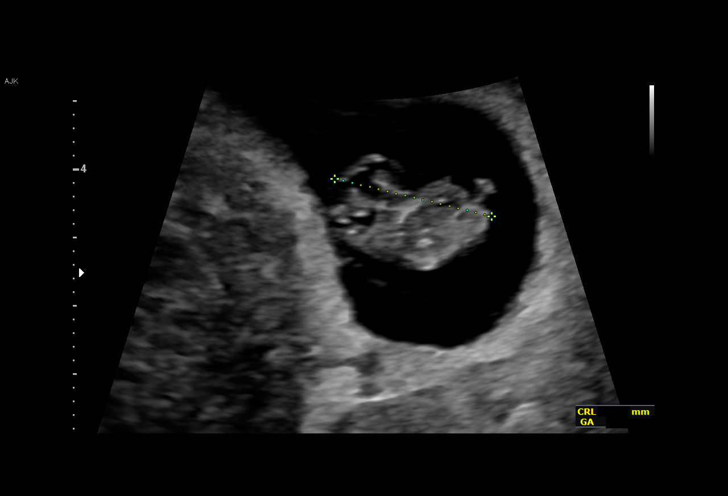
[im 40/44]
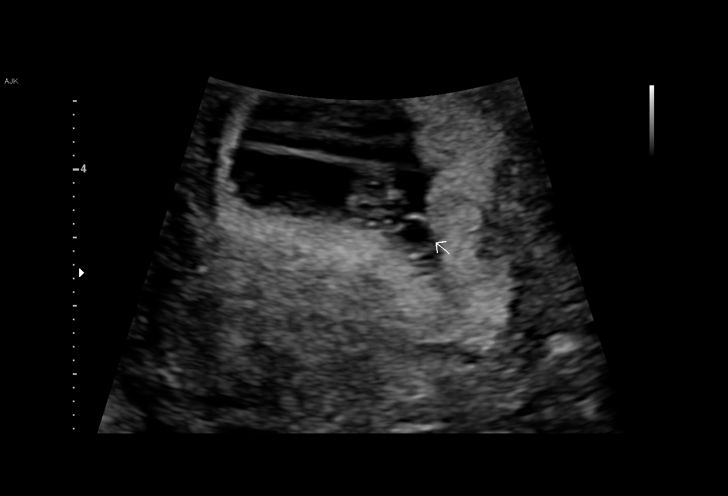
[im 44/44]
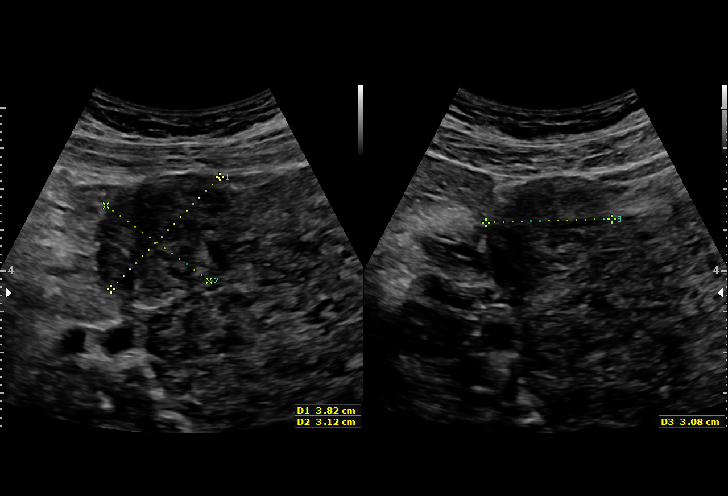

[15 of 28 positions shown; findings below may reference images not displayed]

FINDINGS: Intrauterine gestational sac: Single

Yolk sac:  Visualized

Embryo:  Visualized

Cardiac Activity: Visualized

Heart Rate: 166 bpm

MSD:    mm    w     d

CRL:   23.3 mm   9 w 0 d                  US EDC: 06/30/2019

Subchorionic hemorrhage:  Moderate to large subchorionic hemorrhage.

Maternal uterus/adnexae: Multiple fibroids, the largest 8.7 cm. No
adnexal mass. No free fluid.
IMPRESSION: Nine week intrauterine pregnancy. Fetal heart rate 166 beats per
minute. Moderate to large subchorionic hemorrhage.

Multiple fibroids, the largest 8.7 cm.

## 2020-08-27 DIAGNOSIS — Z113 Encounter for screening for infections with a predominantly sexual mode of transmission: Secondary | ICD-10-CM | POA: Diagnosis not present

## 2020-09-03 DIAGNOSIS — D251 Intramural leiomyoma of uterus: Secondary | ICD-10-CM | POA: Diagnosis not present

## 2020-09-03 DIAGNOSIS — N96 Recurrent pregnancy loss: Secondary | ICD-10-CM | POA: Diagnosis not present

## 2020-09-03 DIAGNOSIS — E288 Other ovarian dysfunction: Secondary | ICD-10-CM | POA: Diagnosis not present

## 2020-09-03 DIAGNOSIS — Z3200 Encounter for pregnancy test, result unknown: Secondary | ICD-10-CM | POA: Diagnosis not present

## 2020-09-10 DIAGNOSIS — Z113 Encounter for screening for infections with a predominantly sexual mode of transmission: Secondary | ICD-10-CM | POA: Diagnosis not present

## 2020-09-10 DIAGNOSIS — N96 Recurrent pregnancy loss: Secondary | ICD-10-CM | POA: Diagnosis not present

## 2020-09-10 DIAGNOSIS — Z319 Encounter for procreative management, unspecified: Secondary | ICD-10-CM | POA: Diagnosis not present

## 2020-09-17 DIAGNOSIS — N96 Recurrent pregnancy loss: Secondary | ICD-10-CM | POA: Diagnosis not present

## 2020-09-17 DIAGNOSIS — Z3141 Encounter for fertility testing: Secondary | ICD-10-CM | POA: Diagnosis not present

## 2020-09-17 DIAGNOSIS — N711 Chronic inflammatory disease of uterus: Secondary | ICD-10-CM | POA: Diagnosis not present

## 2020-10-24 DIAGNOSIS — Z3200 Encounter for pregnancy test, result unknown: Secondary | ICD-10-CM | POA: Diagnosis not present

## 2020-10-24 DIAGNOSIS — N96 Recurrent pregnancy loss: Secondary | ICD-10-CM | POA: Diagnosis not present

## 2020-10-24 DIAGNOSIS — E288 Other ovarian dysfunction: Secondary | ICD-10-CM | POA: Diagnosis not present

## 2020-10-24 DIAGNOSIS — D251 Intramural leiomyoma of uterus: Secondary | ICD-10-CM | POA: Diagnosis not present

## 2020-10-29 DIAGNOSIS — N711 Chronic inflammatory disease of uterus: Secondary | ICD-10-CM | POA: Diagnosis not present

## 2020-10-29 DIAGNOSIS — N856 Intrauterine synechiae: Secondary | ICD-10-CM | POA: Diagnosis not present

## 2020-11-01 DIAGNOSIS — Z9889 Other specified postprocedural states: Secondary | ICD-10-CM | POA: Diagnosis not present

## 2020-12-03 DIAGNOSIS — F900 Attention-deficit hyperactivity disorder, predominantly inattentive type: Secondary | ICD-10-CM | POA: Diagnosis not present

## 2020-12-03 DIAGNOSIS — Z23 Encounter for immunization: Secondary | ICD-10-CM | POA: Diagnosis not present

## 2020-12-26 DIAGNOSIS — N8500 Endometrial hyperplasia, unspecified: Secondary | ICD-10-CM | POA: Diagnosis not present

## 2020-12-26 DIAGNOSIS — N711 Chronic inflammatory disease of uterus: Secondary | ICD-10-CM | POA: Diagnosis not present

## 2021-01-20 DIAGNOSIS — N970 Female infertility associated with anovulation: Secondary | ICD-10-CM | POA: Diagnosis not present

## 2021-01-20 DIAGNOSIS — E288 Other ovarian dysfunction: Secondary | ICD-10-CM | POA: Diagnosis not present

## 2021-01-20 DIAGNOSIS — N96 Recurrent pregnancy loss: Secondary | ICD-10-CM | POA: Diagnosis not present

## 2021-02-10 DIAGNOSIS — N856 Intrauterine synechiae: Secondary | ICD-10-CM | POA: Diagnosis not present

## 2021-02-21 DIAGNOSIS — E2839 Other primary ovarian failure: Secondary | ICD-10-CM | POA: Diagnosis not present

## 2021-02-21 DIAGNOSIS — E288 Other ovarian dysfunction: Secondary | ICD-10-CM | POA: Diagnosis not present

## 2021-02-21 DIAGNOSIS — Z3200 Encounter for pregnancy test, result unknown: Secondary | ICD-10-CM | POA: Diagnosis not present

## 2021-02-21 DIAGNOSIS — N96 Recurrent pregnancy loss: Secondary | ICD-10-CM | POA: Diagnosis not present

## 2021-02-21 DIAGNOSIS — D251 Intramural leiomyoma of uterus: Secondary | ICD-10-CM | POA: Diagnosis not present

## 2021-02-28 DIAGNOSIS — N883 Incompetence of cervix uteri: Secondary | ICD-10-CM | POA: Diagnosis not present

## 2021-02-28 DIAGNOSIS — Z3201 Encounter for pregnancy test, result positive: Secondary | ICD-10-CM | POA: Diagnosis not present

## 2021-02-28 DIAGNOSIS — Z113 Encounter for screening for infections with a predominantly sexual mode of transmission: Secondary | ICD-10-CM | POA: Diagnosis not present

## 2021-02-28 DIAGNOSIS — N970 Female infertility associated with anovulation: Secondary | ICD-10-CM | POA: Diagnosis not present

## 2021-03-03 DIAGNOSIS — N970 Female infertility associated with anovulation: Secondary | ICD-10-CM | POA: Diagnosis not present

## 2021-03-03 DIAGNOSIS — N96 Recurrent pregnancy loss: Secondary | ICD-10-CM | POA: Diagnosis not present

## 2021-03-03 DIAGNOSIS — E2839 Other primary ovarian failure: Secondary | ICD-10-CM | POA: Diagnosis not present

## 2021-03-03 DIAGNOSIS — E288 Other ovarian dysfunction: Secondary | ICD-10-CM | POA: Diagnosis not present

## 2021-03-03 DIAGNOSIS — D251 Intramural leiomyoma of uterus: Secondary | ICD-10-CM | POA: Diagnosis not present

## 2021-03-05 DIAGNOSIS — N883 Incompetence of cervix uteri: Secondary | ICD-10-CM | POA: Diagnosis not present

## 2021-03-05 DIAGNOSIS — N970 Female infertility associated with anovulation: Secondary | ICD-10-CM | POA: Diagnosis not present

## 2021-03-05 DIAGNOSIS — Z113 Encounter for screening for infections with a predominantly sexual mode of transmission: Secondary | ICD-10-CM | POA: Diagnosis not present

## 2021-03-05 DIAGNOSIS — Z3201 Encounter for pregnancy test, result positive: Secondary | ICD-10-CM | POA: Diagnosis not present

## 2021-03-21 DIAGNOSIS — N970 Female infertility associated with anovulation: Secondary | ICD-10-CM | POA: Diagnosis not present

## 2021-03-21 DIAGNOSIS — Z3201 Encounter for pregnancy test, result positive: Secondary | ICD-10-CM | POA: Diagnosis not present

## 2021-03-21 DIAGNOSIS — E2839 Other primary ovarian failure: Secondary | ICD-10-CM | POA: Diagnosis not present

## 2021-03-21 DIAGNOSIS — N883 Incompetence of cervix uteri: Secondary | ICD-10-CM | POA: Diagnosis not present

## 2021-03-21 DIAGNOSIS — Z113 Encounter for screening for infections with a predominantly sexual mode of transmission: Secondary | ICD-10-CM | POA: Diagnosis not present

## 2021-04-01 DIAGNOSIS — Z3200 Encounter for pregnancy test, result unknown: Secondary | ICD-10-CM | POA: Diagnosis not present

## 2021-04-01 DIAGNOSIS — E288 Other ovarian dysfunction: Secondary | ICD-10-CM | POA: Diagnosis not present

## 2021-04-01 DIAGNOSIS — D251 Intramural leiomyoma of uterus: Secondary | ICD-10-CM | POA: Diagnosis not present

## 2021-04-01 DIAGNOSIS — N96 Recurrent pregnancy loss: Secondary | ICD-10-CM | POA: Diagnosis not present

## 2021-05-09 DIAGNOSIS — N96 Recurrent pregnancy loss: Secondary | ICD-10-CM | POA: Diagnosis not present

## 2021-05-09 DIAGNOSIS — N971 Female infertility of tubal origin: Secondary | ICD-10-CM | POA: Diagnosis not present

## 2021-05-09 DIAGNOSIS — E288 Other ovarian dysfunction: Secondary | ICD-10-CM | POA: Diagnosis not present

## 2021-05-12 DIAGNOSIS — Z3201 Encounter for pregnancy test, result positive: Secondary | ICD-10-CM | POA: Diagnosis not present

## 2021-05-12 DIAGNOSIS — Z113 Encounter for screening for infections with a predominantly sexual mode of transmission: Secondary | ICD-10-CM | POA: Diagnosis not present

## 2021-05-12 DIAGNOSIS — N883 Incompetence of cervix uteri: Secondary | ICD-10-CM | POA: Diagnosis not present

## 2021-05-12 DIAGNOSIS — N970 Female infertility associated with anovulation: Secondary | ICD-10-CM | POA: Diagnosis not present

## 2021-05-14 ENCOUNTER — Other Ambulatory Visit (HOSPITAL_COMMUNITY): Payer: Self-pay

## 2021-05-14 DIAGNOSIS — N883 Incompetence of cervix uteri: Secondary | ICD-10-CM | POA: Diagnosis not present

## 2021-05-14 DIAGNOSIS — Z3201 Encounter for pregnancy test, result positive: Secondary | ICD-10-CM | POA: Diagnosis not present

## 2021-05-14 DIAGNOSIS — Z113 Encounter for screening for infections with a predominantly sexual mode of transmission: Secondary | ICD-10-CM | POA: Diagnosis not present

## 2021-05-14 DIAGNOSIS — N970 Female infertility associated with anovulation: Secondary | ICD-10-CM | POA: Diagnosis not present

## 2021-05-14 MED ORDER — MENOTROPINS 75 UNITS ~~LOC~~ SOLR
75.0000 [IU] | SUBCUTANEOUS | 0 refills | Status: DC
  Filled 2021-05-14: qty 5, 5d supply, fill #0

## 2021-06-06 ENCOUNTER — Other Ambulatory Visit (HOSPITAL_COMMUNITY): Payer: Self-pay

## 2021-06-06 DIAGNOSIS — N883 Incompetence of cervix uteri: Secondary | ICD-10-CM | POA: Diagnosis not present

## 2021-06-06 DIAGNOSIS — Z3201 Encounter for pregnancy test, result positive: Secondary | ICD-10-CM | POA: Diagnosis not present

## 2021-06-06 DIAGNOSIS — Z113 Encounter for screening for infections with a predominantly sexual mode of transmission: Secondary | ICD-10-CM | POA: Diagnosis not present

## 2021-06-06 DIAGNOSIS — N970 Female infertility associated with anovulation: Secondary | ICD-10-CM | POA: Diagnosis not present

## 2021-06-06 MED ORDER — CHORIOGONADOTROPIN ALFA 250 MCG/0.5ML ~~LOC~~ INJ
INJECTION | SUBCUTANEOUS | 0 refills | Status: DC
  Filled 2021-06-06: qty 0.5, 1d supply, fill #0

## 2021-06-06 MED ORDER — MENOPUR 75 UNITS ~~LOC~~ SOLR
75.0000 [IU] | Freq: Every day | SUBCUTANEOUS | 3 refills | Status: DC
  Filled 2021-06-06: qty 5, 5d supply, fill #0

## 2021-06-23 DIAGNOSIS — Z3201 Encounter for pregnancy test, result positive: Secondary | ICD-10-CM | POA: Diagnosis not present

## 2021-06-23 DIAGNOSIS — Z3200 Encounter for pregnancy test, result unknown: Secondary | ICD-10-CM | POA: Diagnosis not present

## 2021-06-25 DIAGNOSIS — Z3200 Encounter for pregnancy test, result unknown: Secondary | ICD-10-CM | POA: Diagnosis not present

## 2021-06-25 DIAGNOSIS — Z3201 Encounter for pregnancy test, result positive: Secondary | ICD-10-CM | POA: Diagnosis not present

## 2021-07-07 DIAGNOSIS — Z3200 Encounter for pregnancy test, result unknown: Secondary | ICD-10-CM | POA: Diagnosis not present

## 2021-07-21 DIAGNOSIS — O0900 Supervision of pregnancy with history of infertility, unspecified trimester: Secondary | ICD-10-CM | POA: Diagnosis not present

## 2021-07-31 DIAGNOSIS — Z3685 Encounter for antenatal screening for Streptococcus B: Secondary | ICD-10-CM | POA: Diagnosis not present

## 2021-07-31 DIAGNOSIS — Z3481 Encounter for supervision of other normal pregnancy, first trimester: Secondary | ICD-10-CM | POA: Diagnosis not present

## 2021-07-31 LAB — OB RESULTS CONSOLE ABO/RH: RH Type: POSITIVE

## 2021-07-31 LAB — OB RESULTS CONSOLE RPR: RPR: NONREACTIVE

## 2021-07-31 LAB — HEPATITIS C ANTIBODY: HCV Ab: NEGATIVE

## 2021-07-31 LAB — OB RESULTS CONSOLE HEPATITIS B SURFACE ANTIGEN: Hepatitis B Surface Ag: NEGATIVE

## 2021-07-31 LAB — OB RESULTS CONSOLE ANTIBODY SCREEN: Antibody Screen: NEGATIVE

## 2021-07-31 LAB — OB RESULTS CONSOLE HIV ANTIBODY (ROUTINE TESTING): HIV: NONREACTIVE

## 2021-07-31 LAB — OB RESULTS CONSOLE RUBELLA ANTIBODY, IGM: Rubella: NON-IMMUNE/NOT IMMUNE

## 2021-08-07 DIAGNOSIS — Z3400 Encounter for supervision of normal first pregnancy, unspecified trimester: Secondary | ICD-10-CM | POA: Diagnosis not present

## 2021-08-07 DIAGNOSIS — Z124 Encounter for screening for malignant neoplasm of cervix: Secondary | ICD-10-CM | POA: Diagnosis not present

## 2021-08-07 DIAGNOSIS — Z3A10 10 weeks gestation of pregnancy: Secondary | ICD-10-CM | POA: Diagnosis not present

## 2021-08-07 DIAGNOSIS — Z113 Encounter for screening for infections with a predominantly sexual mode of transmission: Secondary | ICD-10-CM | POA: Diagnosis not present

## 2021-08-07 DIAGNOSIS — Z1151 Encounter for screening for human papillomavirus (HPV): Secondary | ICD-10-CM | POA: Diagnosis not present

## 2021-08-07 LAB — OB RESULTS CONSOLE GC/CHLAMYDIA
Chlamydia: NEGATIVE
Neisseria Gonorrhea: NEGATIVE

## 2021-08-19 DIAGNOSIS — Z3A12 12 weeks gestation of pregnancy: Secondary | ICD-10-CM | POA: Diagnosis not present

## 2021-08-19 DIAGNOSIS — Z3682 Encounter for antenatal screening for nuchal translucency: Secondary | ICD-10-CM | POA: Diagnosis not present

## 2021-08-22 ENCOUNTER — Other Ambulatory Visit: Payer: Self-pay | Admitting: Obstetrics and Gynecology

## 2021-08-22 DIAGNOSIS — Z363 Encounter for antenatal screening for malformations: Secondary | ICD-10-CM

## 2021-09-11 DIAGNOSIS — Z3682 Encounter for antenatal screening for nuchal translucency: Secondary | ICD-10-CM | POA: Diagnosis not present

## 2021-09-22 ENCOUNTER — Encounter: Payer: Self-pay | Admitting: *Deleted

## 2021-09-29 ENCOUNTER — Ambulatory Visit: Payer: BC Managed Care – PPO | Attending: Obstetrics and Gynecology

## 2021-09-29 ENCOUNTER — Other Ambulatory Visit: Payer: Self-pay | Admitting: Obstetrics and Gynecology

## 2021-09-29 ENCOUNTER — Ambulatory Visit: Payer: BC Managed Care – PPO | Admitting: *Deleted

## 2021-09-29 ENCOUNTER — Encounter: Payer: Self-pay | Admitting: *Deleted

## 2021-09-29 ENCOUNTER — Ambulatory Visit (HOSPITAL_BASED_OUTPATIENT_CLINIC_OR_DEPARTMENT_OTHER): Payer: BC Managed Care – PPO | Admitting: Maternal & Fetal Medicine

## 2021-09-29 ENCOUNTER — Other Ambulatory Visit: Payer: Self-pay | Admitting: *Deleted

## 2021-09-29 DIAGNOSIS — Z3A18 18 weeks gestation of pregnancy: Secondary | ICD-10-CM

## 2021-09-29 DIAGNOSIS — O3429 Maternal care due to uterine scar from other previous surgery: Secondary | ICD-10-CM | POA: Insufficient documentation

## 2021-09-29 DIAGNOSIS — O09292 Supervision of pregnancy with other poor reproductive or obstetric history, second trimester: Secondary | ICD-10-CM | POA: Insufficient documentation

## 2021-09-29 DIAGNOSIS — Z9889 Other specified postprocedural states: Secondary | ICD-10-CM | POA: Diagnosis not present

## 2021-09-29 DIAGNOSIS — O09522 Supervision of elderly multigravida, second trimester: Secondary | ICD-10-CM

## 2021-09-29 DIAGNOSIS — Z363 Encounter for antenatal screening for malformations: Secondary | ICD-10-CM | POA: Diagnosis not present

## 2021-09-29 NOTE — Progress Notes (Signed)
MFM consultation   Ms. Hefter is a 36 yo G3P0 who is seen at [facility] with an EDD of 03/01/22. She is seen at the request of Dr. Lucillie Garfinkel regarding consultation for prior preterm delivery, myomectomy and currently with an abdominal cerclage.  She is overall doing well. She had a negative Horizon and a pending Panorama.  Ms. Livengood is familiar to me as she was seen in 2020 with for cervical incompetence with an unfortunate delivery of 17 week previable infant.  We discussed future management to include cerclage placement vs vaginal progestrone vs cerclage plus vaginal progesterone.   Since that time she had a SAB. She underwent a myomectomy and had a abdominal cerclage place in 06/2020. These procedures were performed by Dr. Kerin Perna.   She is overall doing well today.     09/29/2021    9:35 AM 04/14/2019    9:15 AM 04/14/2019    8:45 AM  Vitals with BMI  Systolic [phone]  Diastolic 71 76 75  Pulse 75 73 64   Past Medical History:  Diagnosis Date   ADHD (attention deficit hyperactivity disorder)    History of palpitations 04-10-2019  per pt denies any issues since 2016   12/ 2015 pt evaluated by cardiology, dr Raliegh Ip. Meda Coffee;  24 hr montior showed ST w/ frequent PVCs (02-27-2014)  and normal echo (02-27-2014);   last cardiology visit in epic 05-22-2014 pt released with recommendation to lower adderell dose and no caffine   History of pre-term labor    02-22-2019  22 wks 2 days , nonviable    PONV (postoperative nausea and vomiting)    Past Surgical History:  Procedure Laterality Date   BREAST ENHANCEMENT SURGERY Bilateral 09/2007   CERVICAL BIOPSY  W/ LOOP ELECTRODE EXCISION  06/ 2015   @ [facility] in [place] N/A 02/22/2019   Procedure: [place];  Surgeon: Dian Queen, MD;  Location: [facility];  Service: Gynecology;  Laterality: N/A;   DILATION AND EVACUATION N/A 04/14/2019   Procedure: DILATATION AND EVACUATION;  Surgeon: Tyson Dense, MD;  Location: [facility];  Service: Gynecology;  Laterality: N/A;   OPERATIVE ULTRASOUND N/A 04/14/2019   Procedure: OPERATIVE ULTRASOUND;  Surgeon: Tyson Dense, MD;  Location: [facility];  Service: Gynecology;  Laterality: N/A;   TONSILLECTOMY AND ADENOIDECTOMY  child   WISDOM TOOTH EXTRACTION  09/2003   OB History  Gravida Para Term Preterm AB Living  '3 1   1 1    '$ SAB IAB Ectopic Multiple Live Births  1     0      # Outcome Date GA Lbr Len/2nd Weight Sex Delivery Anes PTL Lv  3 Current           2 Preterm 02/22/19 [id number] 1 lb 1 oz (0.482 kg) M Vag-Spont None Y FD  1 SAB 08/2018           Social History   Socioeconomic History   Marital status: Married    Spouse name: Not on file   Number of children: Not on file   Years of education: Not on file   Highest education level: Not on file  Occupational History   Not on file  Tobacco Use   Smoking status: Never   Smokeless tobacco: Never  Vaping Use   Vaping Use: Never used  Substance and Sexual Activity   Alcohol use: Yes  Comment: occasional wine   Drug use: Never   Sexual activity: Yes    Birth control/protection: None  Other Topics Concern   Not on file  Social History Narrative   Not on file   Social Determinants of Health   Financial Resource Strain: Not on file  Food Insecurity: Not on file  Transportation Needs: Not on file  Physical Activity: Not on file  Stress: Not on file  Social Connections: Not on file  Intimate Partner Violence: Not on file   Family History  Problem Relation Age of Onset   Heart attack Neg Hx    Sudden death Neg Hx     Current Outpatient Medications (Endocrine & Metabolic):    Choriogonadotropin Alfa 250 MCG/0.5ML injection, Use as directed   Menotropins (MENOPUR) 75 units SOLR, Inject 75 Units as directed daily.    Current Outpatient Medications (Analgesics):    aspirin EC 81 MG tablet, Take 81 mg by mouth daily.  Swallow whole.   ibuprofen (ADVIL) 200 MG tablet, Take 3 tablets (600 mg total) by mouth every 6 (six) hours as needed.   Current Outpatient Medications (Other):    amphetamine-dextroamphetamine (ADDERALL XR) 25 MG 24 hr capsule, Take 25 mg by mouth every morning.   misoprostol (CYTOTEC) 200 MCG tablet, Take 1 tablet (200 mcg total) by mouth 2 (two) times daily for 2 days.   Prenatal Vit-Fe Fumarate-FA (MULTIVITAMIN-PRENATAL) 27-0.8 MG TABS tablet, Take 1 tablet by mouth daily at 12 noon. Allergies  Allergen Reactions   Azithromycin Hives   Penicillins Hives   Imaging:  Single intrauterine pregnancy here for a detailed anatomy due to AMA, IVF./ Normal anatomy with measurements consistent with dates There is good fetal movement and amniotic fluid volume  The transabdominal cerclage was observed.  A fetal echocardiogram is scheduled at the end of July.  Impression/Counseling:  I discussed the normal nature of today ultrasound. I specifically reviewed the images noting key normal anatomy and current placement of the cerclage.  We discussed the her overall risk are low given her abdominal cerclage. She is no longer taking progesterone.   I explained that the primary recommendation centers around monitoring fetal growth.   We recommend repeat growth at 32 weeks.  Given her prior myomectomy we recommend delivery by cesarean delivery at 37 weeks.  At the time of delivery, the suture can be removed or left in place if future pregnancies are planned.    Failure to remove a transabdominal cerclage has been associated with adverse events several years after placement, including infection and erosion of the cerclage into the vagina. Erosion of synthetic material used in genitourinary procedures is well described, as are complication from prolonged retention of vaginally placed cerclages.   Therefore, an abdominal cerclage probably should be removed when childbearing is complete, even  though this entails an additional surgical procedure.   If the patient is a poor surgical candidate or if the surgical removal is anticipated to be difficult, it would be reasonable to leave the cerclage in place and counsel the patient about the symptoms and signs associated with such complications.    I spent 30 minutes with > 50% in face to face consultation.   All questions answered  Vikki Ports, MD

## 2021-10-16 DIAGNOSIS — O3432 Maternal care for cervical incompetence, second trimester: Secondary | ICD-10-CM | POA: Diagnosis not present

## 2021-10-16 DIAGNOSIS — Q2112 Patent foramen ovale: Secondary | ICD-10-CM | POA: Diagnosis not present

## 2021-10-16 DIAGNOSIS — Z3A20 20 weeks gestation of pregnancy: Secondary | ICD-10-CM | POA: Diagnosis not present

## 2021-10-19 IMAGING — MR MR PELVIS WO/W CM
7 of 12 series · 28 of 48 positions shown · IV contrast (multihance)
Comparison: Hysterosalpingogram on 01/15/2020

CLINICAL DATA: Infertility. Abnormal uterus on recent
hysterosalpingogram. Evaluate for congenital Mullerian duct and/or
renal anomaly.

EXAM:
MRI ABDOMEN AND PELVIS WITHOUT AND WITH CONTRAST
TECHNIQUE: Multiplanar multisequence MR imaging of the abdomen and pelvis was
performed both before and after the administration of intravenous
contrast.
CONTRAST:  12mL MULTIHANCE GADOBENATE DIMEGLUMINE 529 MG/ML IV SOLN

[Series 3: cor haste · coronal · 6.0mm · 0.78mm/px · 3 of 21 slices shown]
[im 1/21]
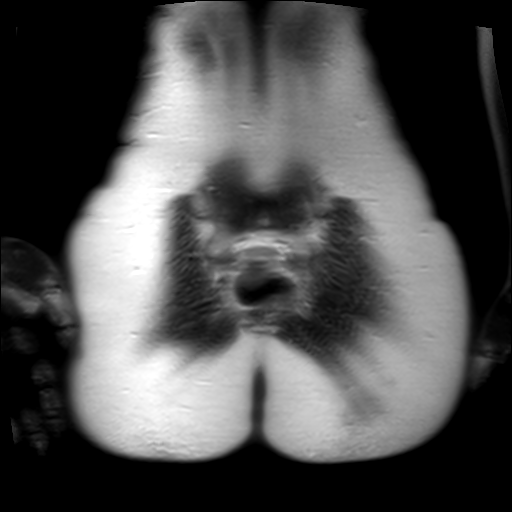
[im 11/21]
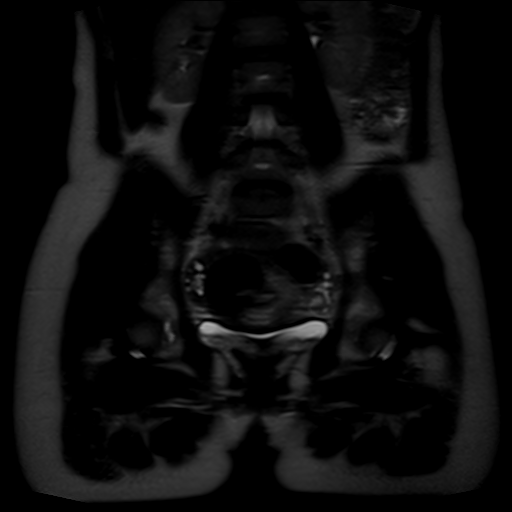
[im 21/21]
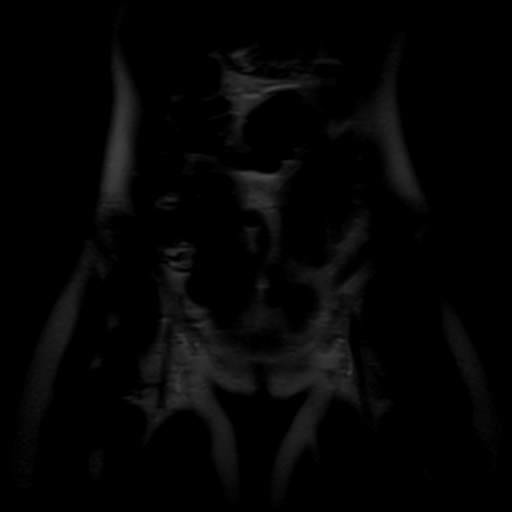

[Series 6: t2_tse_sag · sagittal · 5.0mm · 0.94mm/px · 4 of 24 slices shown (1 of 2)]
[im 1/24]
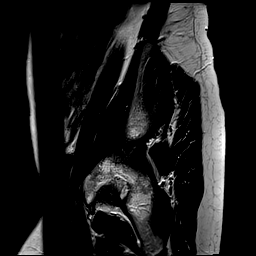
[im 8/24]
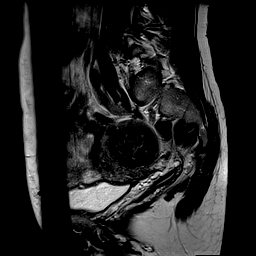
[im 16/24]
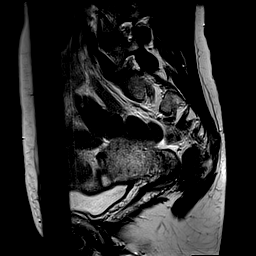
[im 24/24]
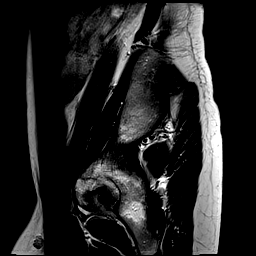

[Series 9: t2_tse cor oblique · axial · 4.0mm · 0.47mm/px · z∈[+12,+89]mm · 3 of 17 slices shown]
[im 1/17]
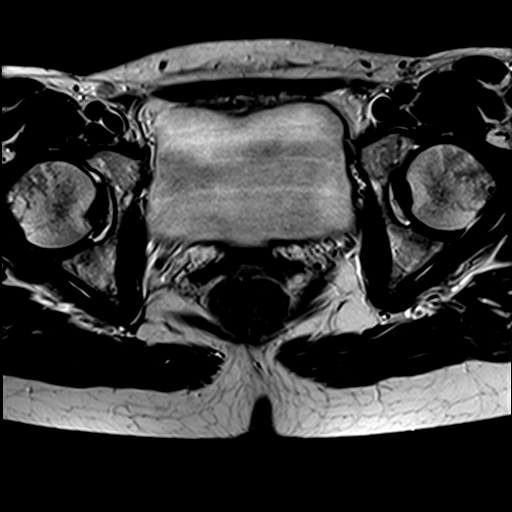
[im 9/17]
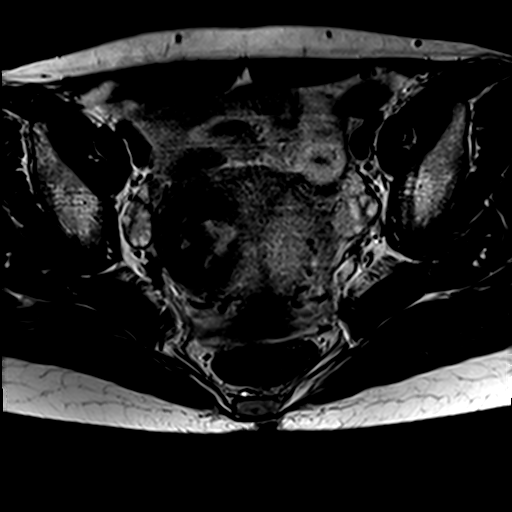
[im 17/17]
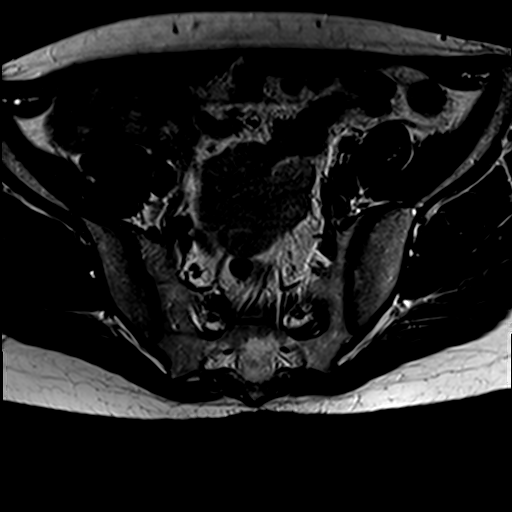

[Series 10: t2_tse_sag · sagittal · 5.0mm · 0.94mm/px · 4 of 24 slices shown (2 of 2)]
[im 1/24]
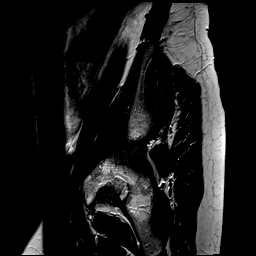
[im 8/24]
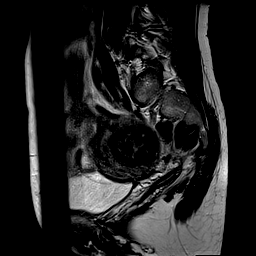
[im 16/24]
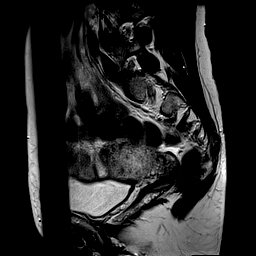
[im 24/24]
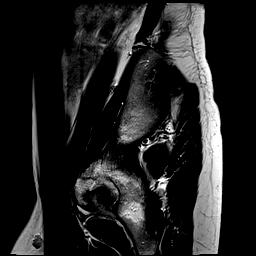

[Series 11: t2_tse axial (oblique) · coronal · 5.0mm · 0.94mm/px · 3 of 18 slices shown]
[im 1/18]
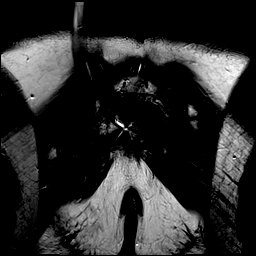
[im 9/18]
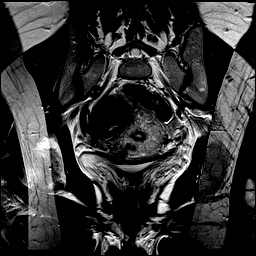
[im 18/18]
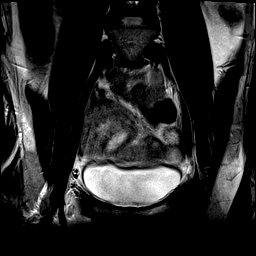

[Series 12: T1 · axial · 5.0mm · 0.55mm/px · z∈[-39,+123]mm · 8 of 56 slices shown]
[im 1/56]
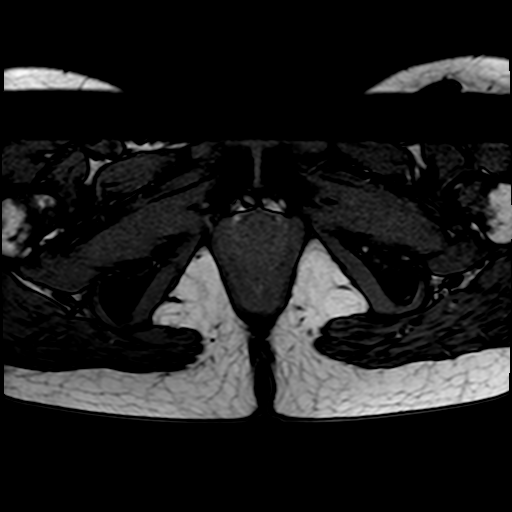
[im 8/56]
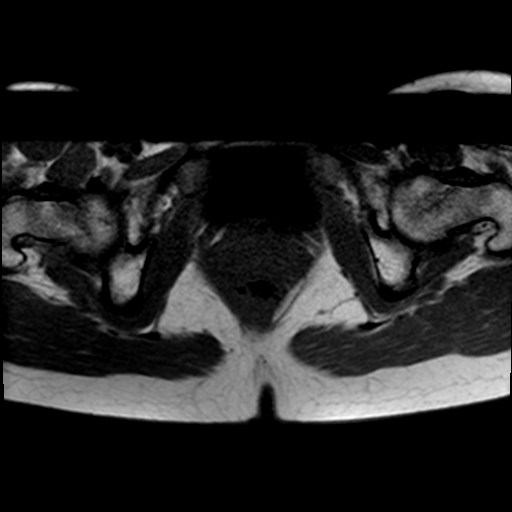
[im 16/56]
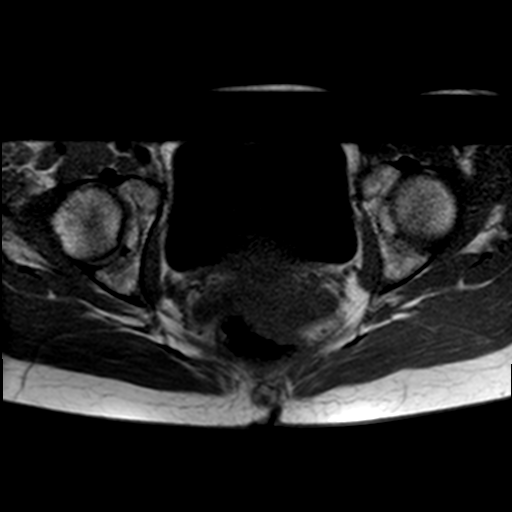
[im 24/56]
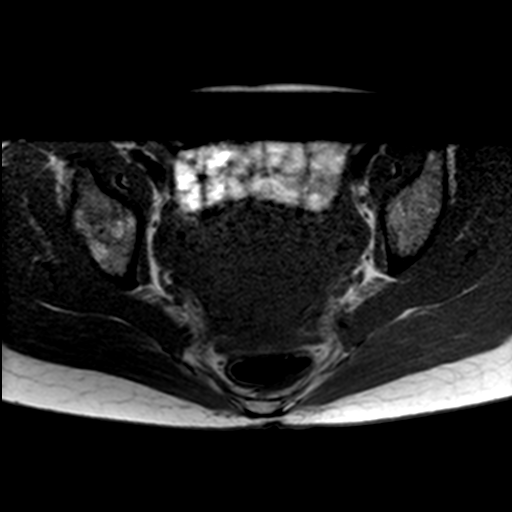
[im 32/56]
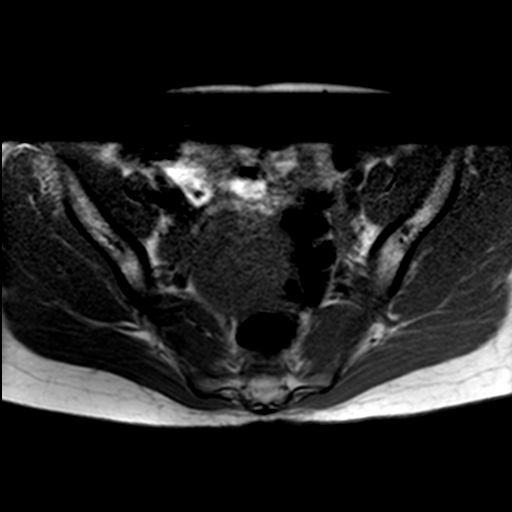
[im 40/56]
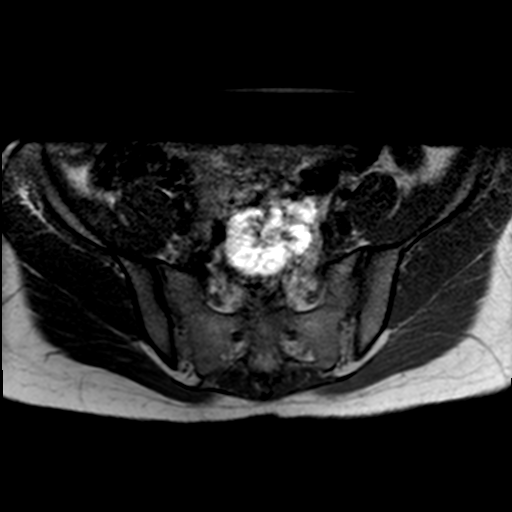
[im 48/56]
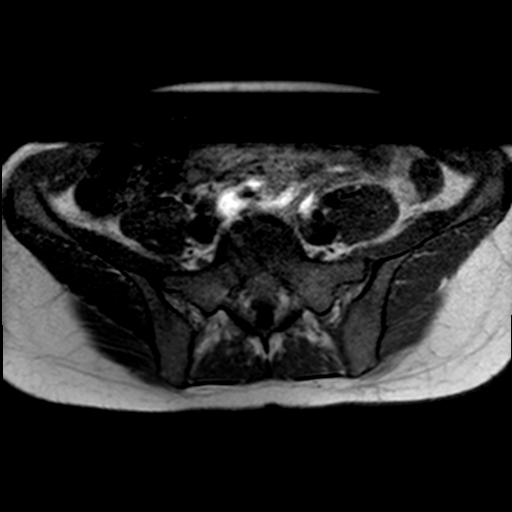
[im 56/56]
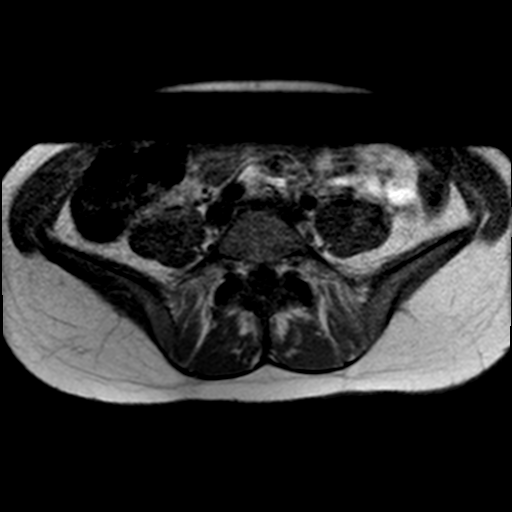

[Series 13: t2_tse axial · axial · 5.0mm · 0.94mm/px · z∈[-42,+66]mm · 3 of 29 slices shown]
[im 1/29]
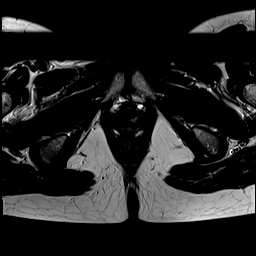
[im 10/29]
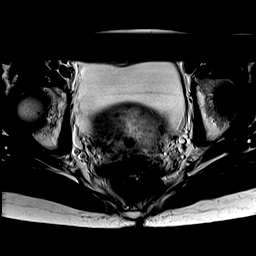
[im 19/29]
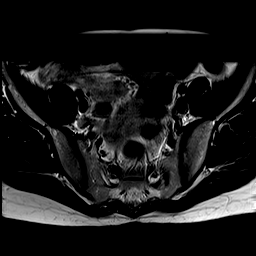

[28 of 48 positions shown; findings below may reference images not displayed]

FINDINGS: COMBINED FINDINGS FOR BOTH MR ABDOMEN AND PELVIS

Lower Chest: No acute findings.

Hepatobiliary: No hepatic masses identified. Gallbladder is
unremarkable. No evidence of biliary ductal dilatation.

Pancreas:  No mass or inflammatory changes.

Spleen: Within normal limits in size and appearance.

Adrenals/Urinary Tract: Normal adrenal glands. Both kidneys are
normal in location. No evidence of renal anomalies. No masses
identified. No evidence of hydronephrosis.

Stomach/Bowel: No evidence of obstruction, inflammatory process or
abnormal fluid collections.

Vascular/Lymphatic: No pathologically enlarged lymph nodes. No
abdominal aortic aneurysm.

Reproductive:

-- Uterus: Measures 8.5 x 6.3 x 7.6 cm (volume = 210 cm^3). An
intramural fibroid is seen right lateral corpus measuring 4.7 cm in
maximum diameter. This fibroid shows near complete cystic
degeneration, with only small areas of residual contrast
enhancement. The endometrial cavity shows communication with this
degenerated fibroid (image [DATE]), and this accounts for the uterine
contrast extravasation seen on recent hysterosalpingogram. A 2nd
tiny intramural fibroid is seen in the anterior lower uterine
segment which measures 1.1 cm in maximum diameter.

Mild concave contour of the fundal wall of the endometrial cavity is
noted, however there is no evidence of a fundal myometrial cleft or
myometrial septum. This is consistent with an arcuate uterus.

-- Right ovary: Appears normal. No mass or inflammatory process
identified.

-- Left ovary: Appears normal. No mass or inflammatory process
identified.

Other:  None.

Musculoskeletal:  No suspicious bone lesions identified.
IMPRESSION: Arcuate uterus.

Normal kidneys. No evidence of associated renal anomaly.

Two intramural uterine fibroids, largest measuring 4.7 cm fibroid
with extensive cystic degeneration.

The endometrial cavity shows communication with this degenerated
fibroid, and accounts for the uterine contrast extravasation seen on
recent hysterosalpingogram.

## 2021-10-19 IMAGING — MR MR ABDOMEN WO/W CM
12 of 18 series · 28 of 48 positions shown · IV contrast (12 ML MULTIHANCE)
Comparison: Hysterosalpingogram on 01/15/2020

CLINICAL DATA: Infertility. Abnormal uterus on recent
hysterosalpingogram. Evaluate for congenital Mullerian duct and/or
renal anomaly.

EXAM:
MRI ABDOMEN AND PELVIS WITHOUT AND WITH CONTRAST
TECHNIQUE: Multiplanar multisequence MR imaging of the abdomen and pelvis was
performed both before and after the administration of intravenous
contrast.
CONTRAST:  12mL MULTIHANCE GADOBENATE DIMEGLUMINE 529 MG/ML IV SOLN

[Series 2: cor haste · coronal · 4.5mm · 0.74mm/px · 2 of 36 slices shown]
[im 1/36]
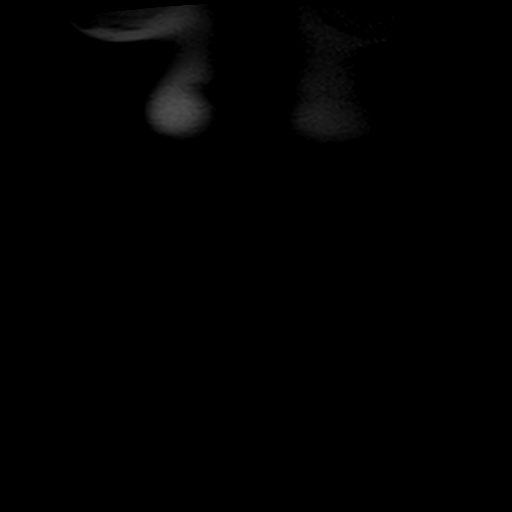
[im 36/36]
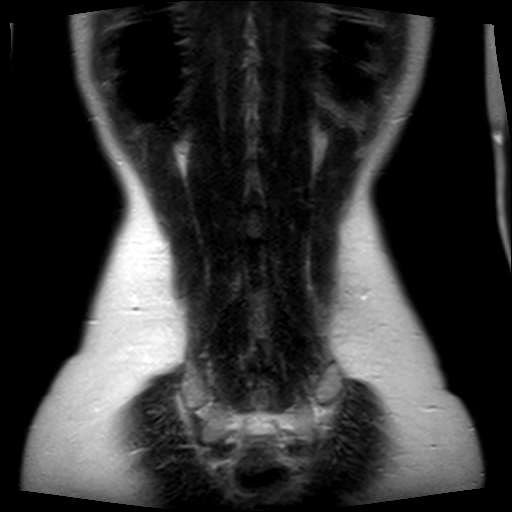

[Series 3: bSSFP · axial · 6.0mm · 0.74mm/px · z∈[+126,+338]mm · 2 of 33 slices shown (1 of 2)]
[im 1/33]
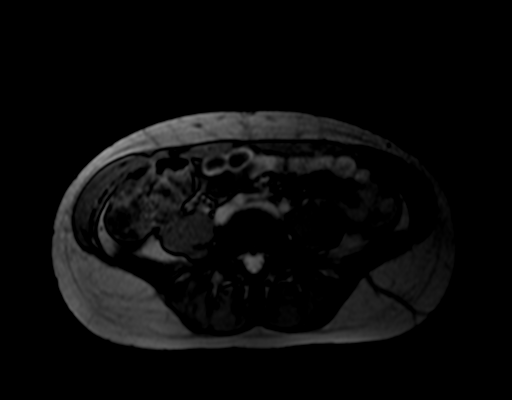
[im 33/33]
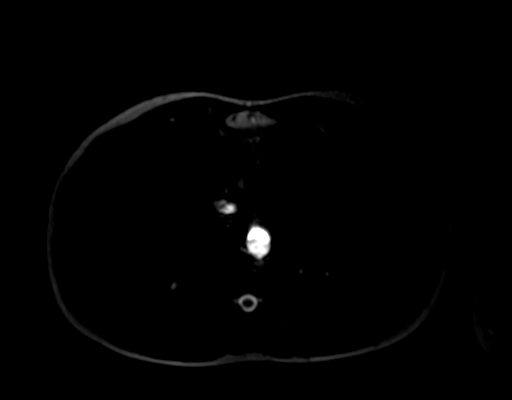

[Series 4: T1 · axial · 6.0mm · 0.74mm/px · z∈[+114,+338]mm · 4 of 70 slices shown]
[im 1/70]
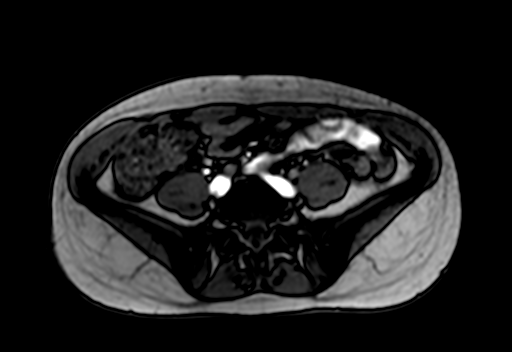
[im 24/70]
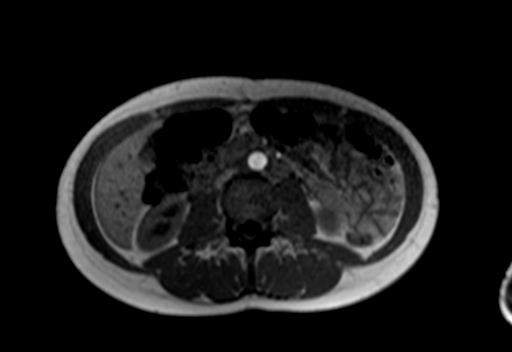
[im 47/70]
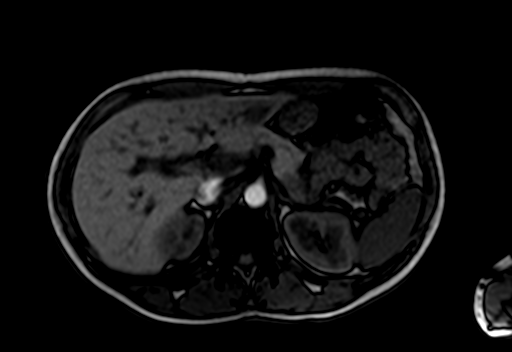
[im 70/70]
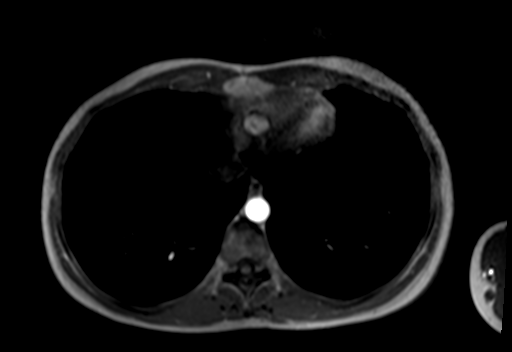

[Series 5: axial haste · axial · 6.0mm · 0.74mm/px · z∈[+114,+338]mm · 2 of 35 slices shown]
[im 1/35]
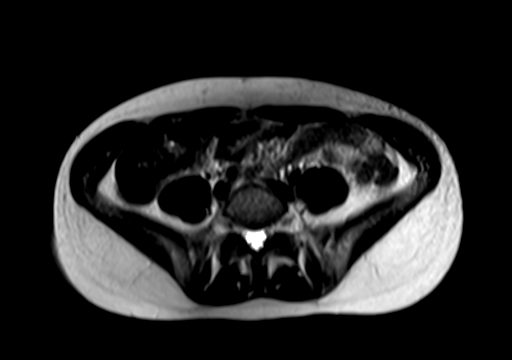
[im 35/35]
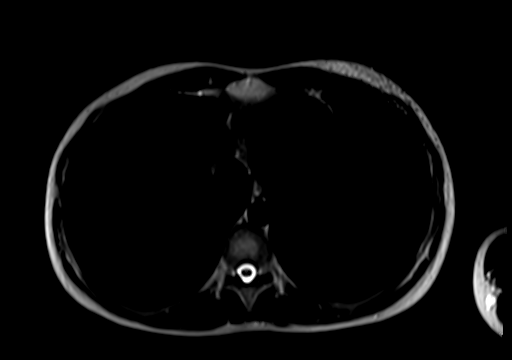

[Series 6: ep2d_diff_b50_500_800_p2_trig · axial · 6.0mm · 1.98mm/px · z∈[+99,+357]mm · 5 of 120 slices shown]
[im 1/120]
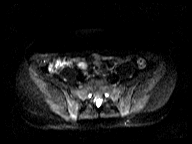
[im 30/120]
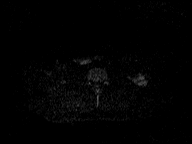
[im 60/120]
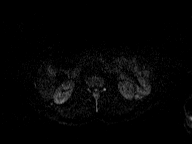
[im 90/120]
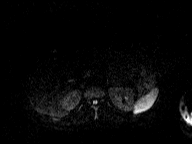
[im 120/120]
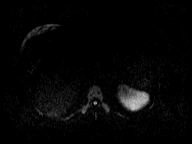

[Series 7: ep2d_diff_b50_500_800_p2_trig_adc · axial · 6.0mm · 1.98mm/px · 1 of 40 slices shown]
[im 1/40]
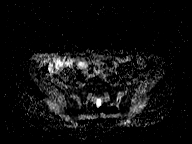

[Series 8: T2 · axial · 6.0mm · 1.12mm/px · 1 of 34 slices shown]
[im 1/34]
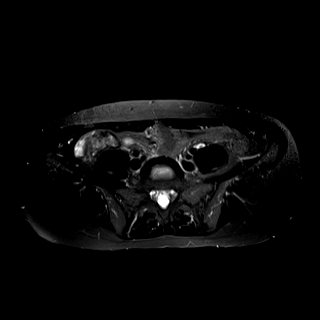

[Series 9: bSSFP · axial · 5.0mm · 0.74mm/px · 1 of 40 slices shown (2 of 2)]
[im 1/40]
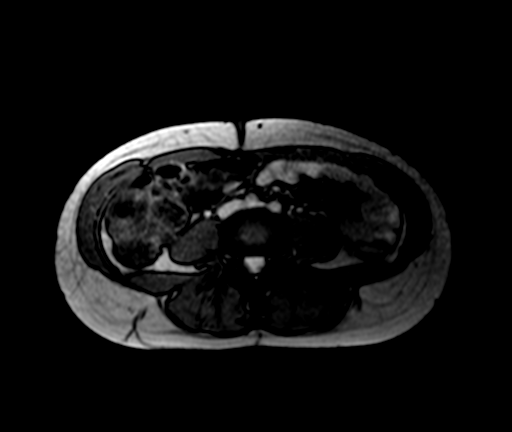

[Series 10: T1 dynamic · axial · non-contrast · 2.5mm · 0.74mm/px · z∈[+128,+345]mm · 3 of 88 slices shown]
[im 1/88]
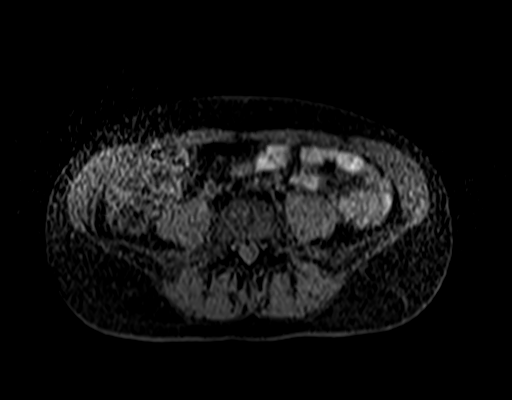
[im 44/88]
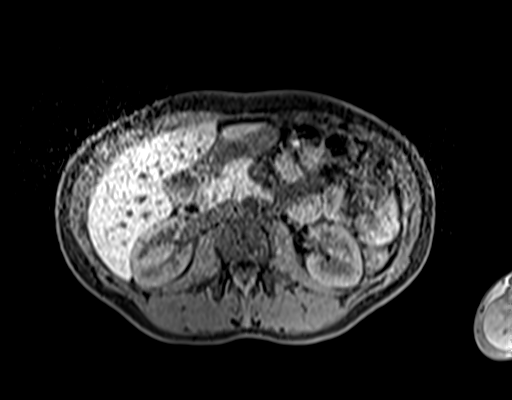
[im 88/88]
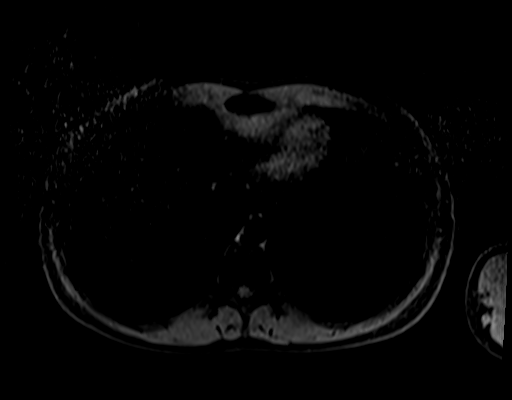

[Series 11: T1 dynamic post-contrast · axial · 2.5mm · 0.74mm/px · z∈[+128,+345]mm · 3 of 88 slices shown (1 of 3)]
[im 1/88]
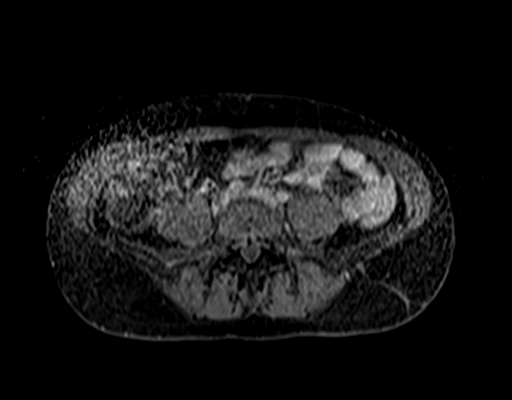
[im 44/88]
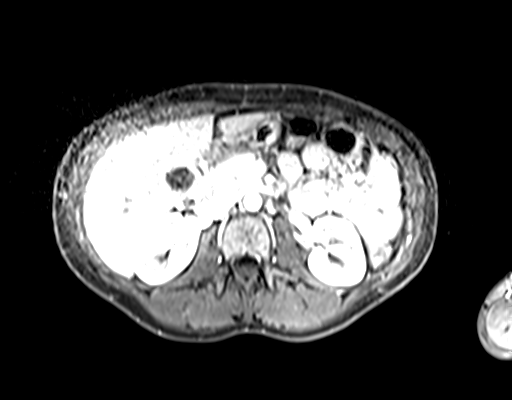
[im 88/88]
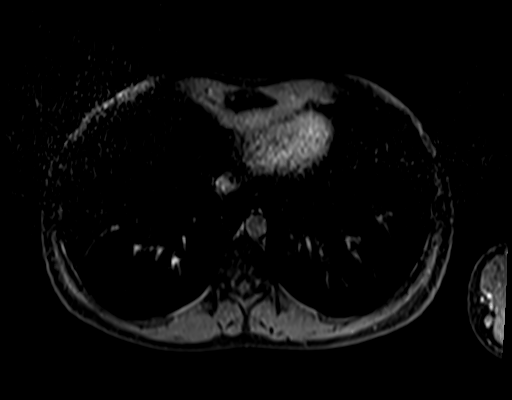

[Series 12: T1 dynamic post-contrast · axial · 2.5mm · 0.74mm/px · z∈[+128,+345]mm · 3 of 88 slices shown (2 of 3)]
[im 1/88]
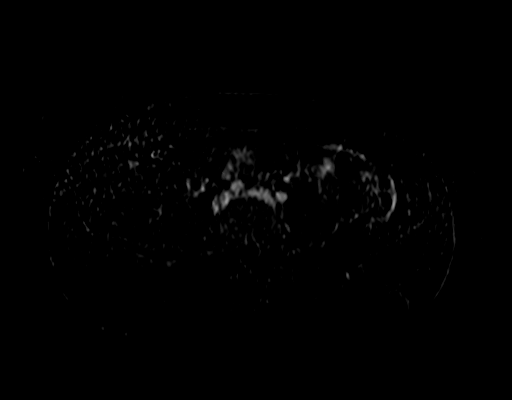
[im 44/88]
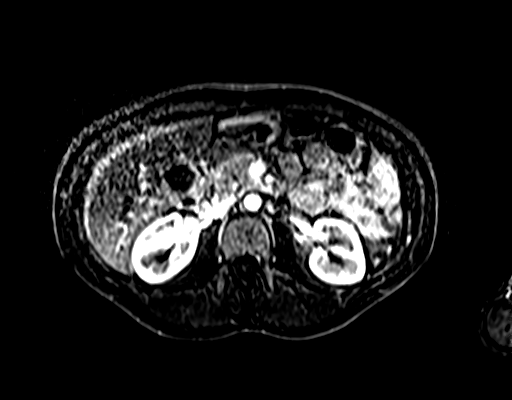
[im 88/88]
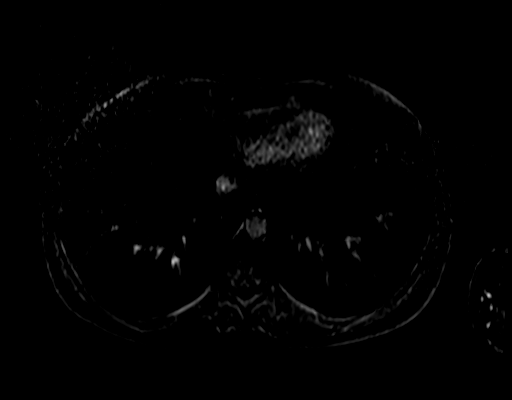

[Series 13: T1 dynamic post-contrast · axial · 2.5mm · 0.74mm/px · 1 of 88 slices shown (3 of 3)]
[im 1/88]
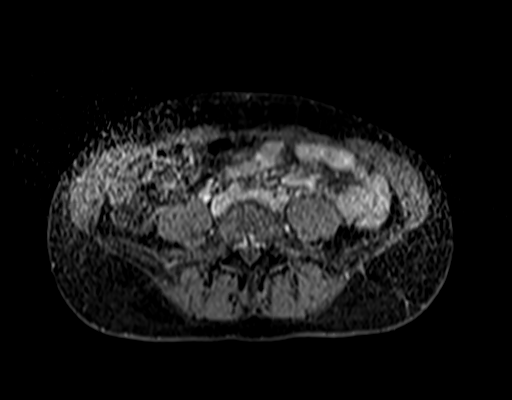

[28 of 48 positions shown; findings below may reference images not displayed]

FINDINGS: COMBINED FINDINGS FOR BOTH MR ABDOMEN AND PELVIS

Lower Chest: No acute findings.

Hepatobiliary: No hepatic masses identified. Gallbladder is
unremarkable. No evidence of biliary ductal dilatation.

Pancreas:  No mass or inflammatory changes.

Spleen: Within normal limits in size and appearance.

Adrenals/Urinary Tract: Normal adrenal glands. Both kidneys are
normal in location. No evidence of renal anomalies. No masses
identified. No evidence of hydronephrosis.

Stomach/Bowel: No evidence of obstruction, inflammatory process or
abnormal fluid collections.

Vascular/Lymphatic: No pathologically enlarged lymph nodes. No
abdominal aortic aneurysm.

Reproductive:

-- Uterus: Measures 8.5 x 6.3 x 7.6 cm (volume = 210 cm^3). An
intramural fibroid is seen right lateral corpus measuring 4.7 cm in
maximum diameter. This fibroid shows near complete cystic
degeneration, with only small areas of residual contrast
enhancement. The endometrial cavity shows communication with this
degenerated fibroid (image [DATE]), and this accounts for the uterine
contrast extravasation seen on recent hysterosalpingogram. A 2nd
tiny intramural fibroid is seen in the anterior lower uterine
segment which measures 1.1 cm in maximum diameter.

Mild concave contour of the fundal wall of the endometrial cavity is
noted, however there is no evidence of a fundal myometrial cleft or
myometrial septum. This is consistent with an arcuate uterus.

-- Right ovary: Appears normal. No mass or inflammatory process
identified.

-- Left ovary: Appears normal. No mass or inflammatory process
identified.

Other:  None.

Musculoskeletal:  No suspicious bone lesions identified.
IMPRESSION: Arcuate uterus.

Normal kidneys. No evidence of associated renal anomaly.

Two intramural uterine fibroids, largest measuring 4.7 cm fibroid
with extensive cystic degeneration.

The endometrial cavity shows communication with this degenerated
fibroid, and accounts for the uterine contrast extravasation seen on
recent hysterosalpingogram.

## 2021-10-30 DIAGNOSIS — Z3A22 22 weeks gestation of pregnancy: Secondary | ICD-10-CM | POA: Diagnosis not present

## 2021-10-30 DIAGNOSIS — O3432 Maternal care for cervical incompetence, second trimester: Secondary | ICD-10-CM | POA: Diagnosis not present

## 2021-11-14 DIAGNOSIS — O3432 Maternal care for cervical incompetence, second trimester: Secondary | ICD-10-CM | POA: Diagnosis not present

## 2021-11-14 DIAGNOSIS — Z3A24 24 weeks gestation of pregnancy: Secondary | ICD-10-CM | POA: Diagnosis not present

## 2021-11-28 DIAGNOSIS — Z23 Encounter for immunization: Secondary | ICD-10-CM | POA: Diagnosis not present

## 2021-11-28 DIAGNOSIS — Z3480 Encounter for supervision of other normal pregnancy, unspecified trimester: Secondary | ICD-10-CM | POA: Diagnosis not present

## 2021-12-03 DIAGNOSIS — O99810 Abnormal glucose complicating pregnancy: Secondary | ICD-10-CM | POA: Diagnosis not present

## 2021-12-18 DIAGNOSIS — M5489 Other dorsalgia: Secondary | ICD-10-CM | POA: Diagnosis not present

## 2021-12-22 DIAGNOSIS — R609 Edema, unspecified: Secondary | ICD-10-CM | POA: Diagnosis not present

## 2021-12-30 DIAGNOSIS — Z23 Encounter for immunization: Secondary | ICD-10-CM | POA: Diagnosis not present

## 2021-12-31 DIAGNOSIS — Z9889 Other specified postprocedural states: Secondary | ICD-10-CM | POA: Diagnosis not present

## 2021-12-31 DIAGNOSIS — H18453 Nodular corneal degeneration, bilateral: Secondary | ICD-10-CM | POA: Diagnosis not present

## 2021-12-31 DIAGNOSIS — G43109 Migraine with aura, not intractable, without status migrainosus: Secondary | ICD-10-CM | POA: Diagnosis not present

## 2022-01-05 ENCOUNTER — Other Ambulatory Visit: Payer: Self-pay | Admitting: *Deleted

## 2022-01-05 ENCOUNTER — Ambulatory Visit: Payer: BC Managed Care – PPO | Admitting: *Deleted

## 2022-01-05 ENCOUNTER — Ambulatory Visit: Payer: BC Managed Care – PPO | Attending: Obstetrics and Gynecology

## 2022-01-05 DIAGNOSIS — O09813 Supervision of pregnancy resulting from assisted reproductive technology, third trimester: Secondary | ICD-10-CM

## 2022-01-05 DIAGNOSIS — O09523 Supervision of elderly multigravida, third trimester: Secondary | ICD-10-CM

## 2022-01-05 DIAGNOSIS — O09522 Supervision of elderly multigravida, second trimester: Secondary | ICD-10-CM | POA: Diagnosis not present

## 2022-01-05 DIAGNOSIS — Z3A32 32 weeks gestation of pregnancy: Secondary | ICD-10-CM | POA: Insufficient documentation

## 2022-01-05 DIAGNOSIS — O3433 Maternal care for cervical incompetence, third trimester: Secondary | ICD-10-CM | POA: Diagnosis not present

## 2022-01-05 DIAGNOSIS — O09293 Supervision of pregnancy with other poor reproductive or obstetric history, third trimester: Secondary | ICD-10-CM | POA: Insufficient documentation

## 2022-01-05 DIAGNOSIS — Z362 Encounter for other antenatal screening follow-up: Secondary | ICD-10-CM | POA: Insufficient documentation

## 2022-01-28 ENCOUNTER — Encounter (HOSPITAL_COMMUNITY): Payer: Self-pay

## 2022-01-28 ENCOUNTER — Telehealth (HOSPITAL_COMMUNITY): Payer: Self-pay | Admitting: *Deleted

## 2022-01-28 NOTE — Telephone Encounter (Signed)
Preadmission screen  

## 2022-01-28 NOTE — Patient Instructions (Signed)
Heather Herrera  01/28/2022   Your procedure is scheduled on:  02/10/2022  Arrive at [place] at Entrance C on [address] at [facility]  and [address]. You are invited to use the FREE valet parking or use the Visitor's parking deck.  Pick up the phone at the desk and dial [phone].  Call this number if you have problems the morning of surgery: [phone]  Remember:   Do not eat food:(After Midnight) Desps de medianoche.  Do not drink clear liquids: (After Midnight) Desps de medianoche.  Take these medicines the morning of surgery with A SIP OF WATER:  none   Do not wear jewelry, make-up or nail polish.  Do not wear lotions, powders, or perfumes. Do not wear deodorant.  Do not shave 48 hours prior to surgery.  Do not bring valuables to the hospital.  [facility] is not   responsible for any belongings or valuables brought to the hospital.  Contacts, dentures or bridgework may not be worn into surgery.  Leave suitcase in the car. After surgery it may be brought to your room.  For patients admitted to the hospital, checkout time is 11:00 AM the day of              discharge.      Please read over the following fact sheets that you were given:     Preparing for Surgery

## 2022-02-02 ENCOUNTER — Ambulatory Visit: Payer: BC Managed Care – PPO | Admitting: *Deleted

## 2022-02-02 ENCOUNTER — Ambulatory Visit: Payer: BC Managed Care – PPO | Attending: Obstetrics and Gynecology

## 2022-02-02 VITALS — BP 134/80 | HR 79

## 2022-02-02 DIAGNOSIS — O09813 Supervision of pregnancy resulting from assisted reproductive technology, third trimester: Secondary | ICD-10-CM | POA: Diagnosis not present

## 2022-02-02 DIAGNOSIS — Z3689 Encounter for other specified antenatal screening: Secondary | ICD-10-CM | POA: Diagnosis not present

## 2022-02-02 DIAGNOSIS — O09523 Supervision of elderly multigravida, third trimester: Secondary | ICD-10-CM | POA: Diagnosis not present

## 2022-02-02 DIAGNOSIS — Z3A36 36 weeks gestation of pregnancy: Secondary | ICD-10-CM | POA: Diagnosis not present

## 2022-02-02 DIAGNOSIS — O09293 Supervision of pregnancy with other poor reproductive or obstetric history, third trimester: Secondary | ICD-10-CM | POA: Diagnosis not present

## 2022-02-02 DIAGNOSIS — Z362 Encounter for other antenatal screening follow-up: Secondary | ICD-10-CM | POA: Insufficient documentation

## 2022-02-02 DIAGNOSIS — Z369 Encounter for antenatal screening, unspecified: Secondary | ICD-10-CM | POA: Diagnosis not present

## 2022-02-02 LAB — OB RESULTS CONSOLE GBS: GBS: POSITIVE

## 2022-02-02 NOTE — H&P (Signed)
Heather Herrera is a 36 y.o. female presenting for scheduled cesarean section. She has a history of cervical incompetence resulting in a previable loss with lats pregnancy. She underwent MMY and abdominal cerclage placement by Dr. Darreld Mclean after that. This is an IVF PGA tested pregnancy. They are expecting a RR boy - "Cira Rue". He has known LGA with last [place] at 36 wga measuring in the 99%ile and AC 97%ile.  She is rubella non-immune. She was on asa for [facility] prevention.   OB History     Gravida  3   Para  1   Term      Preterm  1   AB  1   Living         SAB  1   IAB      Ectopic      Multiple  0   Live Births             Past Medical History:  Diagnosis Date   ADHD (attention deficit hyperactivity disorder)    History of palpitations 04-10-2019  per pt denies any issues since 2016   12/ 2015 pt evaluated by cardiology, dr Raliegh Ip. Meda Coffee;  24 hr montior showed ST w/ frequent PVCs (02-27-2014)  and normal echo (02-27-2014);   last cardiology visit in epic 05-22-2014 pt released with recommendation to lower adderell dose and no caffine   History of pre-term labor    02-22-2019  22 wks 2 days , nonviable    PONV (postoperative nausea and vomiting)    Past Surgical History:  Procedure Laterality Date   BREAST ENHANCEMENT SURGERY Bilateral 09/2007   CERVICAL BIOPSY  W/ LOOP ELECTRODE EXCISION  06/ 2015   @ [facility] in [place] N/A 02/22/2019   Procedure: [place];  Surgeon: Dian Queen, MD;  Location: [facility];  Service: Gynecology;  Laterality: N/A;   DILATION AND EVACUATION N/A 04/14/2019   Procedure: DILATATION AND EVACUATION;  Surgeon: Tyson Dense, MD;  Location: [facility];  Service: Gynecology;  Laterality: N/A;   MYOMECTOMY     and transabdominal cerclage also   OPERATIVE ULTRASOUND N/A 04/14/2019   Procedure: OPERATIVE ULTRASOUND;  Surgeon: Tyson Dense, MD;  Location: [facility];  Service: Gynecology;  Laterality: N/A;   TONSILLECTOMY AND ADENOIDECTOMY  child   WISDOM TOOTH EXTRACTION  09/2003   Family History: family history is not on file. Social History:  reports that she has never smoked. She has never used smokeless tobacco. She reports current alcohol use. She reports that she does not use drugs.     Maternal Diabetes: No Genetic Screening: Normal Maternal Ultrasounds/Referrals: Normal Fetal Ultrasounds or other Referrals:  Fetal echo Maternal Substance Abuse:  No Significant Maternal Medications:  None Significant Maternal Lab Results:  None Number of Prenatal Visits:greater than 3 verified prenatal visits Other Comments:  None  Review of Systems History   There were no vitals taken for this visit. Exam Physical Exam  (from office) NAD, A&O NWOB Abd soft, nondistended, gravid  Prenatal labs: ABO, Rh: O/Positive/-- (05/11 0000) Antibody: Negative (05/11 0000) Rubella: Nonimmune (05/11 0000) RPR: Nonreactive (05/11 0000)  HBsAg: Negative (05/11 0000)  HIV: Non-reactive (05/11 0000)  GBS:     Assessment/Plan: 36 yo G3P0110 presenting for scheduled CS at 29 wga s/s history of abdominal cerclage and cavity-entering MMY.  Risks discussed including infection, bleeding, damage to surrounding structures, the need for additional procedures including  hysterectomy, and the possibility of uterine rupture with neonatal morbidity/mortality, scarring, and abnormal placentation with subsequent pregnancies. Patient agrees to proceed. Gent/vanc on call to OR (hx PCN and azithro allergies).   MMR PP.    Tyson Dense 02/02/2022, 12:11 PM

## 2022-02-02 NOTE — Progress Notes (Unsigned)
[id number] 

## 2022-02-05 DIAGNOSIS — O09219 Supervision of pregnancy with history of pre-term labor, unspecified trimester: Secondary | ICD-10-CM | POA: Diagnosis not present

## 2022-02-06 DIAGNOSIS — O09219 Supervision of pregnancy with history of pre-term labor, unspecified trimester: Secondary | ICD-10-CM | POA: Diagnosis not present

## 2022-02-09 ENCOUNTER — Encounter (HOSPITAL_COMMUNITY)
Admission: RE | Admit: 2022-02-09 | Discharge: 2022-02-09 | Disposition: A | Discharge status: Home / Self Care | Payer: BC Managed Care – PPO | Source: Ambulatory Visit | Attending: Obstetrics and Gynecology | Admitting: Obstetrics and Gynecology

## 2022-02-09 DIAGNOSIS — Z23 Encounter for immunization: Secondary | ICD-10-CM | POA: Diagnosis not present

## 2022-02-09 DIAGNOSIS — O26893 Other specified pregnancy related conditions, third trimester: Secondary | ICD-10-CM | POA: Diagnosis not present

## 2022-02-09 DIAGNOSIS — O3663X0 Maternal care for excessive fetal growth, third trimester, not applicable or unspecified: Secondary | ICD-10-CM | POA: Diagnosis not present

## 2022-02-09 DIAGNOSIS — O3663X1 Maternal care for excessive fetal growth, third trimester, fetus 1: Secondary | ICD-10-CM | POA: Diagnosis not present

## 2022-02-09 DIAGNOSIS — Z01818 Encounter for other preprocedural examination: Secondary | ICD-10-CM | POA: Insufficient documentation

## 2022-02-09 DIAGNOSIS — Z412 Encounter for routine and ritual male circumcision: Secondary | ICD-10-CM | POA: Diagnosis not present

## 2022-02-09 DIAGNOSIS — Z9889 Other specified postprocedural states: Secondary | ICD-10-CM | POA: Diagnosis not present

## 2022-02-09 DIAGNOSIS — O3433 Maternal care for cervical incompetence, third trimester: Secondary | ICD-10-CM | POA: Diagnosis not present

## 2022-02-09 DIAGNOSIS — O3429 Maternal care due to uterine scar from other previous surgery: Secondary | ICD-10-CM | POA: Diagnosis not present

## 2022-02-09 DIAGNOSIS — Z3A37 37 weeks gestation of pregnancy: Secondary | ICD-10-CM | POA: Diagnosis not present

## 2022-02-09 LAB — CBC
HCT: 36.4 % (ref 36.0–46.0)
Hemoglobin: 12.3 g/dL (ref 12.0–15.0)
MCH: 31.1 pg (ref 26.0–34.0)
MCHC: 33.8 g/dL (ref 30.0–36.0)
MCV: 91.9 fL (ref 80.0–100.0)
Platelets: 245 10*3/uL (ref 150–400)
RBC: 3.96 MIL/uL (ref 3.87–5.11)
RDW: 13.5 % (ref 11.5–15.5)
WBC: 13.7 10*3/uL — ABNORMAL HIGH (ref 4.0–10.5)
nRBC: 0.2 % (ref 0.0–0.2)

## 2022-02-09 LAB — TYPE AND SCREEN
ABO/RH(D): O POS
Antibody Screen: NEGATIVE

## 2022-02-09 LAB — RPR: RPR Ser Ql: NONREACTIVE

## 2022-02-10 ENCOUNTER — Other Ambulatory Visit: Payer: Self-pay

## 2022-02-10 ENCOUNTER — Encounter (HOSPITAL_COMMUNITY): Payer: Self-pay | Admitting: Obstetrics and Gynecology

## 2022-02-10 ENCOUNTER — Inpatient Hospital Stay (HOSPITAL_COMMUNITY): Payer: BC Managed Care – PPO | Admitting: Anesthesiology

## 2022-02-10 ENCOUNTER — Encounter (HOSPITAL_COMMUNITY)
Admission: AD | Disposition: A | Discharge status: Home / Self Care | Payer: Self-pay | Source: Home / Self Care | Attending: Obstetrics and Gynecology

## 2022-02-10 ENCOUNTER — Inpatient Hospital Stay (HOSPITAL_COMMUNITY)
Admission: AD | Admit: 2022-02-10 | Discharge: 2022-02-12 | DRG: 788 | Disposition: A | Discharge status: Home / Self Care | Payer: BC Managed Care – PPO | Attending: Obstetrics and Gynecology | Admitting: Obstetrics and Gynecology

## 2022-02-10 DIAGNOSIS — O3433 Maternal care for cervical incompetence, third trimester: Secondary | ICD-10-CM | POA: Diagnosis present

## 2022-02-10 DIAGNOSIS — Z98891 History of uterine scar from previous surgery: Principal | ICD-10-CM

## 2022-02-10 DIAGNOSIS — Z3A37 37 weeks gestation of pregnancy: Secondary | ICD-10-CM

## 2022-02-10 DIAGNOSIS — Z3490 Encounter for supervision of normal pregnancy, unspecified, unspecified trimester: Secondary | ICD-10-CM

## 2022-02-10 DIAGNOSIS — O3663X0 Maternal care for excessive fetal growth, third trimester, not applicable or unspecified: Secondary | ICD-10-CM | POA: Diagnosis present

## 2022-02-10 DIAGNOSIS — O26893 Other specified pregnancy related conditions, third trimester: Secondary | ICD-10-CM | POA: Diagnosis present

## 2022-02-10 DIAGNOSIS — O3663X1 Maternal care for excessive fetal growth, third trimester, fetus 1: Secondary | ICD-10-CM | POA: Diagnosis not present

## 2022-02-10 SURGERY — Surgical Case
Anesthesia: Spinal

## 2022-02-10 MED ORDER — ACETAMINOPHEN 10 MG/ML IV SOLN: 1000.0000 mg | Freq: Once | INTRAVENOUS | Status: DC | PRN

## 2022-02-10 MED ORDER — KETOROLAC TROMETHAMINE 30 MG/ML IJ SOLN
30.0000 mg | Freq: Four times a day (QID) | INTRAMUSCULAR | Status: AC
  Administered 2022-02-10 – 2022-02-11 (×3): 30 mg via INTRAVENOUS
  Filled 2022-02-10 (×3): qty 1

## 2022-02-10 MED ORDER — PHENYLEPHRINE HCL-NACL 20-0.9 MG/250ML-% IV SOLN
INTRAVENOUS | Status: DC | PRN
  Administered 2022-02-10: 60 ug/min via INTRAVENOUS

## 2022-02-10 MED ORDER — MEPERIDINE HCL 25 MG/ML IJ SOLN: 6.2500 mg | INTRAMUSCULAR | Status: DC | PRN

## 2022-02-10 MED ORDER — GENTAMICIN SULFATE 40 MG/ML IJ SOLN
5.0000 mg/kg | INTRAVENOUS | Status: AC
  Administered 2022-02-10: 330 mg via INTRAVENOUS
  Filled 2022-02-10: qty 8.25

## 2022-02-10 MED ORDER — HYDROMORPHONE HCL 1 MG/ML IJ SOLN: 0.2000 mg | INTRAMUSCULAR | Status: DC | PRN

## 2022-02-10 MED ORDER — BUPIVACAINE IN DEXTROSE 0.75-8.25 % IT SOLN
INTRATHECAL | Status: DC | PRN
  Administered 2022-02-10: 1.8 mL via INTRATHECAL

## 2022-02-10 MED ORDER — FENTANYL CITRATE (PF) 100 MCG/2ML IJ SOLN
INTRAMUSCULAR | Status: DC | PRN
  Administered 2022-02-10: 15 ug via INTRATHECAL

## 2022-02-10 MED ORDER — DIPHENHYDRAMINE HCL 25 MG PO CAPS: 25.0000 mg | ORAL_CAPSULE | ORAL | Status: DC | PRN

## 2022-02-10 MED ORDER — SODIUM CHLORIDE 0.9 % IV SOLN: 12.5000 mg | Freq: Once | INTRAVENOUS | Status: DC

## 2022-02-10 MED ORDER — MENTHOL 3 MG MT LOZG
1.0000 | LOZENGE | OROMUCOSAL | Status: DC | PRN
  Administered 2022-02-11 (×2): 3 mg via ORAL
  Filled 2022-02-10 (×2): qty 9

## 2022-02-10 MED ORDER — SIMETHICONE 80 MG PO CHEW
80.0000 mg | CHEWABLE_TABLET | Freq: Three times a day (TID) | ORAL | Status: DC
  Administered 2022-02-10 – 2022-02-12 (×4): 80 mg via ORAL
  Filled 2022-02-10 (×4): qty 1

## 2022-02-10 MED ORDER — ONDANSETRON HCL 4 MG/2ML IJ SOLN: 4.0000 mg | Freq: Three times a day (TID) | INTRAMUSCULAR | Status: DC | PRN

## 2022-02-10 MED ORDER — OXYCODONE HCL 5 MG PO TABS: 5.0000 mg | ORAL_TABLET | ORAL | Status: DC | PRN

## 2022-02-10 MED ORDER — NALOXONE HCL 4 MG/10ML IJ SOLN: 1.0000 ug/kg/h | INTRAVENOUS | Status: DC | PRN

## 2022-02-10 MED ORDER — ACETAMINOPHEN 10 MG/ML IV SOLN
1000.0000 mg | Freq: Once | INTRAVENOUS | Status: AC
  Filled 2022-02-10: qty 100

## 2022-02-10 MED ORDER — OXYCODONE HCL 5 MG PO TABS: 5.0000 mg | ORAL_TABLET | Freq: Once | ORAL | Status: DC | PRN

## 2022-02-10 MED ORDER — ACETAMINOPHEN 500 MG PO TABS
1000.0000 mg | ORAL_TABLET | Freq: Four times a day (QID) | ORAL | Status: DC
  Administered 2022-02-10 – 2022-02-12 (×8): 1000 mg via ORAL
  Filled 2022-02-10 (×8): qty 2

## 2022-02-10 MED ORDER — LACTATED RINGERS IV SOLN: INTRAVENOUS | Status: DC

## 2022-02-10 MED ORDER — PHENYLEPHRINE HCL-NACL 20-0.9 MG/250ML-% IV SOLN
INTRAVENOUS | Status: AC
  Filled 2022-02-10: qty 250

## 2022-02-10 MED ORDER — DIPHENHYDRAMINE HCL 50 MG/ML IJ SOLN: 12.5000 mg | INTRAMUSCULAR | Status: DC | PRN

## 2022-02-10 MED ORDER — SODIUM CHLORIDE 0.9 % IV SOLN
12.5000 mg | Freq: Once | INTRAVENOUS | Status: AC
  Administered 2022-02-10: 12.5 mg via INTRAVENOUS
  Filled 2022-02-10: qty 0.5

## 2022-02-10 MED ORDER — OXYTOCIN-SODIUM CHLORIDE 30-0.9 UT/500ML-% IV SOLN
2.5000 [IU]/h | INTRAVENOUS | Status: AC
  Administered 2022-02-10: 2.5 [IU]/h via INTRAVENOUS
  Filled 2022-02-10: qty 500

## 2022-02-10 MED ORDER — DIPHENHYDRAMINE HCL 25 MG PO CAPS: 25.0000 mg | ORAL_CAPSULE | Freq: Four times a day (QID) | ORAL | Status: DC | PRN

## 2022-02-10 MED ORDER — DIBUCAINE (PERIANAL) 1 % EX OINT: 1.0000 | TOPICAL_OINTMENT | CUTANEOUS | Status: DC | PRN

## 2022-02-10 MED ORDER — ONDANSETRON HCL 4 MG/2ML IJ SOLN
INTRAMUSCULAR | Status: DC | PRN
  Administered 2022-02-10: 4 mg via INTRAVENOUS

## 2022-02-10 MED ORDER — KETOROLAC TROMETHAMINE 30 MG/ML IJ SOLN
30.0000 mg | Freq: Four times a day (QID) | INTRAMUSCULAR | Status: AC | PRN
  Administered 2022-02-10: 30 mg via INTRAVENOUS

## 2022-02-10 MED ORDER — HYDROMORPHONE HCL 1 MG/ML IJ SOLN: 0.2500 mg | INTRAMUSCULAR | Status: DC | PRN

## 2022-02-10 MED ORDER — SODIUM CHLORIDE 0.9% FLUSH: 3.0000 mL | INTRAVENOUS | Status: DC | PRN

## 2022-02-10 MED ORDER — OXYTOCIN-SODIUM CHLORIDE 30-0.9 UT/500ML-% IV SOLN
INTRAVENOUS | Status: DC | PRN
  Administered 2022-02-10: 300 mL via INTRAVENOUS

## 2022-02-10 MED ORDER — KETOROLAC TROMETHAMINE 30 MG/ML IJ SOLN: 30.0000 mg | Freq: Four times a day (QID) | INTRAMUSCULAR | Status: AC | PRN

## 2022-02-10 MED ORDER — SOD CITRATE-CITRIC ACID 500-334 MG/5ML PO SOLN: 30.0000 mL | ORAL | Status: DC

## 2022-02-10 MED ORDER — OXYCODONE HCL 5 MG/5ML PO SOLN: 5.0000 mg | Freq: Once | ORAL | Status: DC | PRN

## 2022-02-10 MED ORDER — SODIUM CHLORIDE 0.9 % IR SOLN
Status: DC | PRN
  Administered 2022-02-10: 1000 mL

## 2022-02-10 MED ORDER — ZOLPIDEM TARTRATE 5 MG PO TABS: 5.0000 mg | ORAL_TABLET | Freq: Every evening | ORAL | Status: DC | PRN

## 2022-02-10 MED ORDER — SCOPOLAMINE 1 MG/3DAYS TD PT72
1.0000 | MEDICATED_PATCH | Freq: Once | TRANSDERMAL | Status: DC
  Administered 2022-02-10: 1.5 mg via TRANSDERMAL
  Filled 2022-02-10: qty 1

## 2022-02-10 MED ORDER — LACTATED RINGERS IV SOLN: INTRAVENOUS | Status: DC | PRN

## 2022-02-10 MED ORDER — MEASLES, MUMPS & RUBELLA VAC IJ SOLR: 0.5000 mL | Freq: Once | INTRAMUSCULAR | Status: DC

## 2022-02-10 MED ORDER — IBUPROFEN 600 MG PO TABS
600.0000 mg | ORAL_TABLET | Freq: Four times a day (QID) | ORAL | Status: DC
  Administered 2022-02-11 – 2022-02-12 (×4): 600 mg via ORAL
  Filled 2022-02-10 (×5): qty 1

## 2022-02-10 MED ORDER — PROMETHAZINE HCL 25 MG/ML IJ SOLN: 6.2500 mg | INTRAMUSCULAR | Status: DC | PRN

## 2022-02-10 MED ORDER — COCONUT OIL OIL
1.0000 | TOPICAL_OIL | Status: DC | PRN
  Administered 2022-02-11: 1 via TOPICAL

## 2022-02-10 MED ORDER — PRENATAL MULTIVITAMIN CH
1.0000 | ORAL_TABLET | Freq: Every day | ORAL | Status: DC
  Administered 2022-02-11: 1 via ORAL
  Filled 2022-02-10 (×2): qty 1

## 2022-02-10 MED ORDER — STERILE WATER FOR IRRIGATION IR SOLN
Status: DC | PRN
  Administered 2022-02-10: 1000 mL

## 2022-02-10 MED ORDER — KETOROLAC TROMETHAMINE 30 MG/ML IJ SOLN
INTRAMUSCULAR | Status: AC
  Filled 2022-02-10: qty 1

## 2022-02-10 MED ORDER — WITCH HAZEL-GLYCERIN EX PADS: 1.0000 | MEDICATED_PAD | CUTANEOUS | Status: DC | PRN

## 2022-02-10 MED ORDER — NALOXONE HCL 0.4 MG/ML IJ SOLN: 0.4000 mg | INTRAMUSCULAR | Status: DC | PRN

## 2022-02-10 MED ORDER — VANCOMYCIN HCL IN DEXTROSE 1-5 GM/200ML-% IV SOLN
1000.0000 mg | Freq: Once | INTRAVENOUS | Status: AC
  Administered 2022-02-10: 1000 mg via INTRAVENOUS
  Filled 2022-02-10: qty 200

## 2022-02-10 MED ORDER — SENNOSIDES-DOCUSATE SODIUM 8.6-50 MG PO TABS
2.0000 | ORAL_TABLET | Freq: Every day | ORAL | Status: DC
  Administered 2022-02-11 – 2022-02-12 (×2): 2 via ORAL
  Filled 2022-02-10 (×2): qty 2

## 2022-02-10 MED ORDER — MORPHINE SULFATE (PF) 0.5 MG/ML IJ SOLN
INTRAMUSCULAR | Status: DC | PRN
  Administered 2022-02-10: 150 ug via INTRATHECAL

## 2022-02-10 MED ORDER — SIMETHICONE 80 MG PO CHEW: 80.0000 mg | CHEWABLE_TABLET | ORAL | Status: DC | PRN

## 2022-02-10 MED ORDER — FENTANYL CITRATE (PF) 100 MCG/2ML IJ SOLN
INTRAMUSCULAR | Status: AC
  Filled 2022-02-10: qty 2

## 2022-02-10 MED ORDER — OXYTOCIN-SODIUM CHLORIDE 30-0.9 UT/500ML-% IV SOLN
INTRAVENOUS | Status: AC
  Filled 2022-02-10: qty 500

## 2022-02-10 MED ORDER — SALINE SPRAY 0.65 % NA SOLN: 2.0000 | NASAL | Status: DC | PRN

## 2022-02-10 MED ORDER — ONDANSETRON HCL 4 MG/2ML IJ SOLN
INTRAMUSCULAR | Status: AC
  Filled 2022-02-10: qty 2

## 2022-02-10 MED ORDER — MORPHINE SULFATE (PF) 0.5 MG/ML IJ SOLN
INTRAMUSCULAR | Status: AC
  Filled 2022-02-10: qty 10

## 2022-02-10 SURGICAL SUPPLY — 32 items
BENZOIN TINCTURE PRP APPL 2/3 (GAUZE/BANDAGES/DRESSINGS) ×1 IMPLANT
CHLORAPREP W/TINT 26ML (MISCELLANEOUS) ×2 IMPLANT
CLAMP UMBILICAL CORD (MISCELLANEOUS) ×1 IMPLANT
CLOTH BEACON ORANGE TIMEOUT ST (SAFETY) ×1 IMPLANT
CLSR STERI-STRIP ANTIMIC 1/2X4 (GAUZE/BANDAGES/DRESSINGS) ×1 IMPLANT
DRSG OPSITE POSTOP 4X10 (GAUZE/BANDAGES/DRESSINGS) ×1 IMPLANT
ELECT REM PT RETURN 9FT ADLT (ELECTROSURGICAL) ×1
ELECTRODE REM PT RTRN 9FT ADLT (ELECTROSURGICAL) ×1 IMPLANT
EXTRACTOR VACUUM KIWI (MISCELLANEOUS) IMPLANT
GAUZE SPONGE 4X4 12PLY STRL LF (GAUZE/BANDAGES/DRESSINGS) IMPLANT
GLOVE BIO SURGEON STRL SZ 6.5 (GLOVE) ×1 IMPLANT
GLOVE BIOGEL PI IND STRL 6.5 (GLOVE) ×1 IMPLANT
GLOVE BIOGEL PI IND STRL 7.0 (GLOVE) ×2 IMPLANT
GOWN STRL REUS W/TWL LRG LVL3 (GOWN DISPOSABLE) ×2 IMPLANT
KIT ABG SYR 3ML LUER SLIP (SYRINGE) ×1 IMPLANT
NDL HYPO 25X5/8 SAFETYGLIDE (NEEDLE) ×1 IMPLANT
NEEDLE HYPO 25X5/8 SAFETYGLIDE (NEEDLE) ×1 IMPLANT
NS IRRIG 1000ML POUR BTL (IV SOLUTION) ×1 IMPLANT
PACK C SECTION WH (CUSTOM PROCEDURE TRAY) ×1 IMPLANT
PAD ABD 7.5X8 STRL (GAUZE/BANDAGES/DRESSINGS) IMPLANT
PAD OB MATERNITY 4.3X12.25 (PERSONAL CARE ITEMS) ×1 IMPLANT
STRIP CLOSURE SKIN 1/2X4 (GAUZE/BANDAGES/DRESSINGS) IMPLANT
SUT PLAIN 0 NONE (SUTURE) IMPLANT
SUT PLAIN 2 0 (SUTURE) ×1
SUT PLAIN ABS 2-0 CT1 27XMFL (SUTURE) ×1 IMPLANT
SUT VIC AB 0 CT1 36 (SUTURE) ×1 IMPLANT
SUT VIC AB 0 CTX 36 (SUTURE) ×2
SUT VIC AB 0 CTX36XBRD ANBCTRL (SUTURE) ×2 IMPLANT
SUT VIC AB 4-0 PS2 27 (SUTURE) ×1 IMPLANT
TOWEL OR 17X24 6PK STRL BLUE (TOWEL DISPOSABLE) ×1 IMPLANT
TRAY FOLEY W/BAG SLVR 14FR LF (SET/KITS/TRAYS/PACK) IMPLANT
WATER STERILE IRR 1000ML POUR (IV SOLUTION) ×1 IMPLANT

## 2022-02-10 NOTE — Lactation Note (Signed)
This note was copied from a baby's chart. Lactation Consultation Note  Patient Name: Heather Herrera Date: 02/10/2022 Reason for consult: Initial assessment;1st time breastfeeding;Primapara;Early term 37-38.6wks;Breastfeeding assistance;Breast augmentation;Other (Comment) (IVF) Age:36 hours  LC in to visit with P1 birth parent of ET infant delivered by C/S.  Baby has been to the breast twice.  Baby showing subtle cues as FOB was holding baby.  LC offered to help with positioning and latching.  Baby positioned in football hold on right breast, reviewed hand expression for tiny drops of colostrum.  Baby latched easily with a wide gape of his mouth.  Baby sucked with deep jaw extensions and swallows.  Mom given some brief basic education due to her not feeling well.    Talked about adding pumping to plan due to little breast changes in early pregnancy.  Mom had breast augmentation in 2009.  Mom declined [place] setting up a pump as she is exhausted and nauseated.  Encouraged STS with baby and offering the breast often with feeding cues. Mom will let her RN know if and when she feels up to double pumping after baby breastfeeds. Mom to ask for help prn.  Maternal Data Has patient been taught Hand Expression?: Yes Does the patient have breastfeeding experience prior to this delivery?: No (Neonatal loss at 22 weeks)2   Feeding Mother's Current Feeding Choice: Breast Milk  LATCH Score Latch: Grasps breast easily, tongue down, lips flanged, rhythmical sucking.  Audible Swallowing: Spontaneous and intermittent  Type of Nipple: Everted at rest and after stimulation  Comfort (Breast/Nipple): Soft / non-tender  Hold (Positioning): Assistance needed to correctly position infant at breast and maintain latch.  LATCH Score: 9  Interventions Interventions: Breast feeding basics reviewed;Assisted with latch;Skin to skin;Breast massage;Hand express;Breast compression;Adjust position;Support  pillows;Position options;Expressed milk;LC Services brochure  Discharge Pump: Personal;DEBP (Spectra 2) WIC Program: No  Consult Status Consult Status: Follow-up Date: 02/11/22 Follow-up type: In-patient    Heather Herrera 02/10/2022, 1:09 PM

## 2022-02-10 NOTE — Anesthesia Preprocedure Evaluation (Signed)
Anesthesia Evaluation  Patient identified by MRN, date of birth, ID band Patient awake    Reviewed: Allergy & Precautions, H&P , NPO status , Patient's Chart, lab work & pertinent test results  History of Anesthesia Complications Negative for: history of anesthetic complications  Airway Mallampati: II  TM Distance: >3 FB     Dental   Pulmonary neg pulmonary ROS   Pulmonary exam normal        Cardiovascular negative cardio ROS  Rhythm:regular Rate:Normal     Neuro/Psych negative neurological ROS  negative psych ROS   GI/Hepatic negative GI ROS, Neg liver ROS,,,  Endo/Other  negative endocrine ROS    Renal/GU negative Renal ROS  negative genitourinary   Musculoskeletal   Abdominal   Peds  Hematology negative hematology ROS (+)   Anesthesia Other Findings   Reproductive/Obstetrics (+) Pregnancy G3P0110 at [redacted]w[redacted]d                             Anesthesia Physical Anesthesia Plan  ASA: 2  Anesthesia Plan: Spinal   Post-op Pain Management:    Induction:   PONV Risk Score and Plan: Ondansetron and Treatment may vary due to age or medical condition  Airway Management Planned:   Additional Equipment:   Intra-op Plan:   Post-operative Plan:   Informed Consent: I have reviewed the patients History and Physical, chart, labs and discussed the procedure including the risks, benefits and alternatives for the proposed anesthesia with the patient or authorized representative who has indicated his/her understanding and acceptance.       Plan Discussed with: Anesthesiologist  Anesthesia Plan Comments:          Anesthesia Quick Evaluation

## 2022-02-10 NOTE — Progress Notes (Signed)
No updates to above H&P. Patient arrived NPO and was consented in PACU. Risks again discussed, all questions answered, and consent signed. Proceed with above surgery.    Lakoda Mcanany MD  

## 2022-02-10 NOTE — Anesthesia Postprocedure Evaluation (Signed)
Anesthesia Post Note  Patient: Heather Herrera  Procedure(s) Performed: PRIMARY CESAREAN SECTION EDC: 03-01-22 ALLERG: PENICILLIN ITHROMAX ZPAK     Patient location during evaluation: PACU Anesthesia Type: Spinal Level of consciousness: oriented and awake and alert Pain management: pain level controlled Vital Signs Assessment: post-procedure vital signs reviewed and stable Respiratory status: spontaneous breathing, respiratory function stable and nonlabored ventilation Cardiovascular status: blood pressure returned to baseline and stable Postop Assessment: no headache, no backache, no apparent nausea or vomiting and spinal receding Anesthetic complications: no   No notable events documented.  Last Vitals:  Vitals:   02/10/22 1124 02/10/22 1239  BP: 98/65 114/75  Pulse: 77 80  Resp:    Temp: 36.7 C 37.3 C  SpO2: 98% 97%    Last Pain:  Vitals:   02/10/22 1124  TempSrc: Oral  PainSc:    Pain Goal:                   Lidia Collum

## 2022-02-10 NOTE — Anesthesia Procedure Notes (Signed)
Spinal  Patient location during procedure: OR Reason for block: surgical anesthesia Staffing Performed: anesthesiologist  Anesthesiologist: Lidia Collum, MD Performed by: Lidia Collum, MD Authorized by: Lidia Collum, MD   Preanesthetic Checklist Completed: patient identified, IV checked, risks and benefits discussed, surgical consent, monitors and equipment checked, pre-op evaluation and timeout performed Spinal Block Patient position: sitting Prep: DuraPrep and site prepped and draped Patient monitoring: continuous pulse ox, blood pressure and heart rate Approach: midline Location: L3-4 Injection technique: single-shot Needle Needle type: Pencan  Needle gauge: 24 G Needle length: 10 cm Assessment Events: CSF return Additional Notes Functioning IV was confirmed and monitors were applied. Sterile prep and drape, including hand hygiene and sterile gloves were used. The patient was positioned and the spine was prepped. The skin was anesthetized with lidocaine.  Free flow of clear CSF was obtained prior to injecting local anesthetic into the CSF. The needle was carefully withdrawn. The patient tolerated the procedure well.

## 2022-02-10 NOTE — Transfer of Care (Signed)
Immediate Anesthesia Transfer of Care Note  Patient: Heather Herrera  Procedure(s) Performed: PRIMARY CESAREAN SECTION EDC: 03-01-22 ALLERG: PENICILLIN ITHROMAX ZPAK  Patient Location: PACU  Anesthesia Type:Spinal  Level of Consciousness: awake  Airway & Oxygen Therapy: Patient Spontanous Breathing  Post-op Assessment: Report given to RN and Post -op Vital signs reviewed and stable  Post vital signs: Reviewed and stable  Last Vitals:  Vitals Value Taken Time  BP 119/75 02/10/22 0851  Temp 36.4 C 02/10/22 0851  Pulse 74 02/10/22 0854  Resp 18 02/10/22 0854  SpO2 98 % 02/10/22 0854  Vitals shown include unvalidated device data.  Last Pain:  Vitals:   02/10/22 0611  TempSrc: Oral         Complications: No notable events documented.

## 2022-02-10 NOTE — Progress Notes (Signed)
Dr. Gaetano Net called to get another form of medication in case patient can't take her PO tylenol. IV tylenol ordered just in case its needed for pain control until nausea subsides.

## 2022-02-10 NOTE — Op Note (Addendum)
PROCEDURE DATE: 02/10/22   PREOPERATIVE DIAGNOSIS:  1.) Hx abdominal cerclage 2.) Hx cavity entering myomectomy   POSTOPERATIVE DIAGNOSIS: The same   PROCEDURE:    Primary Low Transverse Cesarean Section   SURGEON:  Dr. Lucillie Garfinkel ASST: Dr. Trina Ao  An experienced assistant was required given the standard of surgical care given the complexity of the case.  This assistant was needed for exposure, dissection, suctioning, retraction, instrument exchange, assisting with delivery with administration of fundal pressure, and for overall help during the procedure.      INDICATIONS: This is a 36 yo G3P0110 at 76 wga requiring cesarean section secondary to history of abdominal cerclage and cavity entering mmy..  Decision made to proceed with LTCS. The risks of cesarean section discussed with the patient included but were not limited to: bleeding which may require transfusion or reoperation; infection which may require antibiotics; injury to bowel, bladder, ureters or other surrounding organs; injury to the fetus; need for additional procedures including hysterectomy in the event of a life-threatening hemorrhage; placental abnormalities wth subsequent pregnancies, incisional problems, thromboembolic phenomenon and other postoperative/anesthesia complications. The patient agreed with the proposed plan, giving informed consent for the procedure.     FINDINGS:  Viable female infant in vertex presentation, APGARspending,  Weight pending, Amniotic fluid clear,  Intact placenta, three vessel cord.  Grossly normal uterus with some filmy adhesions from prior surgeries. Abdominal cerclage in place and intact directly above bladder.  .   ANESTHESIA:    Epidural ESTIMATED BLOOD LOSS: 331cc SPECIMENS: Placenta for routine COMPLICATIONS: None immediate   PROCEDURE IN DETAIL:  The patient received intravenous antibiotics (2g Ancef) and had sequential compression devices applied to her lower extremities while in the  preoperative area.  She was then taken to the operating room where epidural anesthesia was dosed up to surgical level and was found to be adequate. She was then placed in a dorsal supine position with a leftward tilt, and prepped and draped in a sterile manner.  A foley catheter was placed into her bladder and attached to constant gravity.  After an adequate timeout was performed, a Pfannenstiel skin incision was made with scalpel and carried through to the underlying layer of fascia. The fascia was incised in the midline and this incision was extended bilaterally using the Mayo scissors. Kocher clamps were applied to the superior aspect of the fascial incision and the underlying rectus muscles were dissected off bluntly. A similar process was carried out on the inferior aspect of the facial incision. The rectus muscles were separated in the midline bluntly and the peritoneum was entered bluntly.  A bladder flap was created sharply and developed bluntly. A transverse hysterotomy was made with a scalpel and extended bilaterally bluntly. The bladder blade was then removed. The infant was successfully delivered, and cord was clamped and cut and infant was handed over to awaiting neonatology team. Uterine massage was then administered and the placenta delivered intact with three-vessel cord. Cord gases were taken. The uterus was cleared of clot and debris.  The hysterotomy was closed with 0 vicryl.  A second imbricating suture of 0-vicryl was used to reinforce the incision and aid in hemostasis.The fascia was closed with 0-Vicryl in a running fashion with good restoration of anatomy.  The subcutaneus tissue was irrigated and was reapproximated using three interrupted plain gut stitches.  The skin was closed with 4-0 Vicryl in a subcuticular fashion.  All surgical site and was hemostatic at end of procedure) without any further  bleeding on exam.    It's a boy - "[place]!"    Pt tolerated the procedure well. All  sponge/lap/needle counts were correct  X 2. Pt taken to recovery room in stable condition.     Lucillie Garfinkel MD

## 2022-02-11 LAB — CBC
HCT: 32.2 % — ABNORMAL LOW (ref 36.0–46.0)
Hemoglobin: 10.9 g/dL — ABNORMAL LOW (ref 12.0–15.0)
MCH: 30.9 pg (ref 26.0–34.0)
MCHC: 33.9 g/dL (ref 30.0–36.0)
MCV: 91.2 fL (ref 80.0–100.0)
Platelets: 196 10*3/uL (ref 150–400)
RBC: 3.53 MIL/uL — ABNORMAL LOW (ref 3.87–5.11)
RDW: 13.8 % (ref 11.5–15.5)
WBC: 12.7 10*3/uL — ABNORMAL HIGH (ref 4.0–10.5)
nRBC: 0 % (ref 0.0–0.2)

## 2022-02-11 LAB — BIRTH TISSUE RECOVERY COLLECTION (PLACENTA DONATION)

## 2022-02-11 MED ORDER — GUAIFENESIN 100 MG/5ML PO LIQD
5.0000 mL | ORAL | Status: DC | PRN
  Administered 2022-02-11 (×4): 5 mL via ORAL
  Filled 2022-02-11 (×6): qty 5

## 2022-02-11 NOTE — Lactation Note (Signed)
This note was copied from a baby's chart. Lactation Consultation Note  Patient Name: Heather Herrera RDEYC'X Date: 02/11/2022 Reason for consult: Follow-up assessment;Early term 37-38.6wks;Primapara;1st time breastfeeding Age:36 hours   P1: Early term infant at 37+2 weeks Feeding preference: Breast Weight loss: 6%  Baby was swaddled and asleep in father's arms when I arrived.  He received a circumcision this afternoon; parents concerned that he was not interested in waking/breast feeding.  Reassurance given.  Discussed feeding behavior after circumcision.  Suggested mother continue lots of STS, breast massage and hand expression and feed back any colostrum obtained.  Parents report that he has been feeding well prior to the circumcision.  He is just sleepy now.  Mother is not interested in using the electric pump.  Recommended she begin later this evening/tonight if he continues to be sleepy and not interested in waking to breast feed.  Encouraged to alert RN for any feeding difficulties. Parents verbalized understanding.     Maternal Data Has patient been taught Hand Expression?: Yes Does the patient have breastfeeding experience prior to this delivery?: No  Feeding Mother's Current Feeding Choice: Breast Milk  LATCH Score                    Lactation Tools Discussed/Used    Interventions Interventions: Education;Breast feeding basics reviewed  Discharge Pump: Personal (Spectra)  Consult Status Consult Status: Follow-up Date: 02/12/22 Follow-up type: In-patient    Little Ishikawa 02/11/2022, 5:28 PM

## 2022-02-11 NOTE — Progress Notes (Signed)
Postpartum Progress Note  Postpartum Day 1 s/p primary Cesarean section.  Subjective:  Patient reports no overnight events.  She reports well controlled pain, ambulating without difficulty, voiding spontaneously, tolerating PO.  She reports Negative flatus, Negative BM.  Vaginal bleeding is minimal.  Objective: Blood pressure 106/63, pulse 71, temperature 98.7 F (37.1 C), temperature source Oral, resp. rate 18, height '5\' 5"'$  (1.651 m), weight 80 kg, SpO2 97 %, unknown if currently breastfeeding.  Physical Exam:  General: alert and no distress Lochia: appropriate Uterine Fundus: firm Incision: dressing in place DVT Evaluation: No evidence of DVT seen on physical exam.  Recent Labs    02/09/22 0902 02/11/22 0506  HGB 12.3 10.9*  HCT 36.4 32.2*    Assessment/Plan: Postpartum Day 1, s/p C-section Baby boy -  Lactation following Doing well, continue routine postpartum care. Anticipate discharge PPD#2 or 3.    LOS: 1 day   Carlyon Shadow 02/11/2022, 7:37 AM

## 2022-02-12 MED ORDER — ACETAMINOPHEN 325 MG PO TABS: 650.0000 mg | ORAL_TABLET | Freq: Four times a day (QID) | ORAL | 1 refills | Status: AC | PRN

## 2022-02-12 MED ORDER — IBUPROFEN 600 MG PO TABS: 600.0000 mg | ORAL_TABLET | Freq: Four times a day (QID) | ORAL | 0 refills | Status: AC

## 2022-02-12 MED ORDER — POLYETHYLENE GLYCOL 3350 17 G PO PACK: 17.0000 g | PACK | Freq: Every day | ORAL | Status: DC

## 2022-02-12 MED ORDER — POLYETHYLENE GLYCOL 3350 17 G PO PACK: 17.0000 g | PACK | Freq: Every day | ORAL | 0 refills | Status: AC

## 2022-02-12 MED ORDER — SIMETHICONE 80 MG PO CHEW: 80.0000 mg | CHEWABLE_TABLET | Freq: Three times a day (TID) | ORAL | 0 refills | Status: AC

## 2022-02-12 MED ORDER — OXYCODONE HCL 5 MG PO TABS: 5.0000 mg | ORAL_TABLET | ORAL | 0 refills | Status: AC | PRN

## 2022-02-12 NOTE — Lactation Note (Signed)
This note was copied from a baby's chart. Lactation Consultation Note  Patient Name: Heather Herrera ALPFX'T Date: 02/12/2022 Reason for consult: Follow-up assessment;Primapara;1st time breastfeeding;Term Age:36 hours  LC in to room, parents are expecting discharge. Infant is sleeping upon arrival. Birthing parent reports using formula as infant was fussy and unable to latch. Birthing parent has a Charity fundraiser DEBP at home Materials engineer). LC offered to check flange size. Noted compression stripes to both nipples. Reinforced deep latch, neck/back support and discouraged shallow latch. Provided a manual pump, fitted 21-mm flange, but 19-mm will be ideal. Colostrum is readily expressed with pump demonstration.  Parents are pace bottle-feeding and have volume guidelines for formula feeding. Discussed normal behavior and patterns after 24h, pumping, clusterfeeding, skin to skin. Talked about milk coming into volume and managing engorgement.   Plan: 1-Feeding on demand or 8-12 times in 24h period. 2-Hand express/pump as needed for supplementation 3-Encouraged birthing parent rest, hydration and food intake.   Contact LC as needed for feeds/support/concerns/questions. All questions answered at this time. Reviewed [facility] brochure.     Maternal Data Has patient been taught Hand Expression?: Yes Does the patient have breastfeeding experience prior to this delivery?: No  Feeding Mother's Current Feeding Choice: Breast Milk and Formula  Lactation Tools Discussed/Used Tools: Pump;Flanges Flange Size: 21 (needs a 19-mm flange) Breast pump type: Manual Pump Education: Setup, frequency, and cleaning;Milk Storage Reason for Pumping: stimulation and supplementation Pumping frequency: as needed Pumped volume:  (drops with demonstration)  Interventions Interventions: Pre-pump if needed;Hand pump;Comfort gels;Expressed milk;Breast feeding basics reviewed;Education;LC Services brochure  Discharge Discharge  Education: Engorgement and breast care;Warning signs for feeding baby Pump: Personal;Manual  Consult Status Consult Status: Complete Date: 02/12/22 Follow-up type: Call as needed    [facility] 02/12/2022, 12:57 PM

## 2022-02-12 NOTE — Discharge Summary (Signed)
Postpartum Discharge Summary  Date of Service updated 02/12/22     Patient Name: Heather Herrera DOB: 1985-05-18 MRN: [id number]  Date of admission: 02/10/2022 Delivery date:02/10/2022  Delivering provider: Tyson Dense  Date of discharge: 02/12/2022  Admitting diagnosis: Pregnancy [Z34.90] Intrauterine pregnancy: [redacted]w[redacted]d    Secondary diagnosis:  Principal Problem:   Pregnancy  Additional problems: Hx of cervical insufficiency, myomectomy, s/p abdominal cerclage, rubella nonimmune    Discharge diagnosis: Term Pregnancy Delivered                                              Post partum procedures: none Augmentation: N/A Complications: None  Hospital course: Sceduled C/S   36y.o. yo [id number] admitted to the hospital 02/10/2022 for scheduled cesarean section with the following indication:Prior Uterine Surgery.Delivery details are as follows:  Membrane Rupture Time/Date: 8:11 AM ,02/10/2022   Delivery Method:C-Section, Low Transverse  Details of operation can be found in separate operative note.  Patient had a postpartum course that was uncomplicated.  She is ambulating, tolerating a regular diet, passing flatus, and urinating well. Patient is discharged home in stable condition on  02/12/22        Newborn Data: Birth date:02/10/2022  Birth time:8:11 AM  Gender:Female  Living status:Living  Apgars:9 ,9  Weight:3510 g     Magnesium Sulfate received: No BMZ received: No Rhophylac:No MMR:Yes T-DaP:Given prenatally Flu: No Transfusion:No  Physical exam  Vitals:   02/10/22 2355 02/11/22 0610 02/11/22 1612 02/12/22 0537  BP: 110/71 106/63 114/60 120/82  Pulse: 84 71 74 76  Resp: _0 Temp: 99 F (37.2 C) 98.7 F (37.1 C) 98.2 F (36.8 C) 97.9 F (36.6 C)  TempSrc: Oral Oral Oral Oral  SpO2:   99% 100%  Weight:      Height:       General: alert, cooperative, and no distress Lochia: appropriate Uterine Fundus: firm Incision: Healing well  with no significant drainage, Dressing is clean, dry, and intact DVT Evaluation: No evidence of DVT seen on physical exam. Labs: Lab Results  Component Value Date   WBC 12.7 (H) 02/11/2022   HGB 10.9 (L) 02/11/2022   HCT 32.2 (L) 02/11/2022   MCV 91.2 02/11/2022   PLT 196 02/11/2022       No data to display         Edinburgh Score:    02/10/2022    6:40 PM  Edinburgh Postnatal Depression Scale Screening Tool  I have been able to laugh and see the funny side of things. 0  I have looked forward with enjoyment to things. 0  I have blamed myself unnecessarily when things went wrong. 1  I have been anxious or worried for no good reason. 1  I have felt scared or panicky for no good reason. 0  Things have been getting on top of me. 1  I have been so unhappy that I have had difficulty sleeping. 0  I have felt sad or miserable. 0  I have been so unhappy that I have been crying. 0  The thought of harming myself has occurred to me. 0  Edinburgh Postnatal Depression Scale Total 3      After visit meds:  Allergies as of 02/12/2022       Reactions   Azithromycin Hives   Penicillins  Hives        Medication List     STOP taking these medications    Adderall XR 25 MG 24 hr capsule Generic drug: amphetamine-dextroamphetamine   Menopur 75 units Solr Generic drug: Menotropins   Ovidrel 250 MCG/0.5ML injection Generic drug: Choriogonadotropin Alfa       TAKE these medications    acetaminophen 325 MG tablet Commonly known as: TYLENOL Take 2 tablets (650 mg total) by mouth every 6 (six) hours as needed for moderate pain.   ibuprofen 600 MG tablet Commonly known as: ADVIL Take 1 tablet (600 mg total) by mouth every 6 (six) hours.   multivitamin-prenatal 27-0.8 MG Tabs tablet Take 1 tablet by mouth daily at 12 noon.   oxyCODONE 5 MG immediate release tablet Commonly known as: Oxy IR/ROXICODONE Take 1 tablet (5 mg total) by mouth every 4 (four) hours as needed for  moderate pain.   polyethylene glycol 17 g packet Commonly known as: MIRALAX / GLYCOLAX Take 17 g by mouth daily.   simethicone 80 MG chewable tablet Commonly known as: MYLICON Chew 1 tablet (80 mg total) by mouth 3 (three) times daily after meals.         Discharge home in stable condition Infant Feeding: Bottle and Breast Infant Disposition:home with mother Discharge instruction: per After Visit Summary and Postpartum booklet. Activity: Advance as tolerated. Pelvic rest for 6 weeks.  Diet: routine diet Anticipated Birth Control: Unsure Postpartum Appointment:6 weeks Additional Postpartum F/U:  none   02/12/2022 Charlotta Newton, MD

## 2022-02-18 ENCOUNTER — Telehealth (HOSPITAL_COMMUNITY): Payer: Self-pay | Admitting: *Deleted

## 2022-02-18 NOTE — Telephone Encounter (Signed)
Left phone voicemail message.  Odis Hollingshead, RN 02-18-2022 at 10:08am

## 2022-02-24 DIAGNOSIS — R102 Pelvic and perineal pain: Secondary | ICD-10-CM | POA: Diagnosis not present

## 2022-02-25 DIAGNOSIS — O900 Disruption of cesarean delivery wound: Secondary | ICD-10-CM | POA: Diagnosis not present

## 2022-03-25 DIAGNOSIS — Z1389 Encounter for screening for other disorder: Secondary | ICD-10-CM | POA: Diagnosis not present

## 2022-05-01 DIAGNOSIS — Z23 Encounter for immunization: Secondary | ICD-10-CM | POA: Diagnosis not present

## 2022-05-01 DIAGNOSIS — F900 Attention-deficit hyperactivity disorder, predominantly inattentive type: Secondary | ICD-10-CM | POA: Diagnosis not present

## 2022-05-01 DIAGNOSIS — Z79899 Other long term (current) drug therapy: Secondary | ICD-10-CM | POA: Diagnosis not present

## 2022-06-16 DIAGNOSIS — K080 Exfoliation of teeth due to systemic causes: Secondary | ICD-10-CM | POA: Diagnosis not present

## 2022-10-01 DIAGNOSIS — Z6823 Body mass index (BMI) 23.0-23.9, adult: Secondary | ICD-10-CM | POA: Diagnosis not present

## 2022-10-01 DIAGNOSIS — Z01419 Encounter for gynecological examination (general) (routine) without abnormal findings: Secondary | ICD-10-CM | POA: Diagnosis not present

## 2022-10-30 DIAGNOSIS — F900 Attention-deficit hyperactivity disorder, predominantly inattentive type: Secondary | ICD-10-CM | POA: Diagnosis not present

## 2022-10-30 DIAGNOSIS — Z79899 Other long term (current) drug therapy: Secondary | ICD-10-CM | POA: Diagnosis not present

## 2023-01-02 DIAGNOSIS — Z23 Encounter for immunization: Secondary | ICD-10-CM | POA: Diagnosis not present

## 2023-02-24 DIAGNOSIS — Z9889 Other specified postprocedural states: Secondary | ICD-10-CM | POA: Diagnosis not present

## 2023-02-24 DIAGNOSIS — G43109 Migraine with aura, not intractable, without status migrainosus: Secondary | ICD-10-CM | POA: Diagnosis not present

## 2023-02-24 DIAGNOSIS — H18453 Nodular corneal degeneration, bilateral: Secondary | ICD-10-CM | POA: Diagnosis not present

## 2023-05-05 DIAGNOSIS — Z0000 Encounter for general adult medical examination without abnormal findings: Secondary | ICD-10-CM | POA: Diagnosis not present

## 2023-05-05 DIAGNOSIS — Z131 Encounter for screening for diabetes mellitus: Secondary | ICD-10-CM | POA: Diagnosis not present

## 2023-05-05 DIAGNOSIS — Z13220 Encounter for screening for lipoid disorders: Secondary | ICD-10-CM | POA: Diagnosis not present

## 2023-05-05 DIAGNOSIS — F900 Attention-deficit hyperactivity disorder, predominantly inattentive type: Secondary | ICD-10-CM | POA: Diagnosis not present

## 2023-05-05 DIAGNOSIS — R946 Abnormal results of thyroid function studies: Secondary | ICD-10-CM | POA: Diagnosis not present

## 2023-10-11 DIAGNOSIS — Z6822 Body mass index (BMI) 22.0-22.9, adult: Secondary | ICD-10-CM | POA: Diagnosis not present

## 2023-10-11 DIAGNOSIS — Z01419 Encounter for gynecological examination (general) (routine) without abnormal findings: Secondary | ICD-10-CM | POA: Diagnosis not present

## 2023-12-28 DIAGNOSIS — M79661 Pain in right lower leg: Secondary | ICD-10-CM | POA: Diagnosis not present

## 2023-12-28 DIAGNOSIS — M79662 Pain in left lower leg: Secondary | ICD-10-CM | POA: Diagnosis not present

## 2023-12-28 DIAGNOSIS — Z23 Encounter for immunization: Secondary | ICD-10-CM | POA: Diagnosis not present

## 2023-12-28 DIAGNOSIS — I83893 Varicose veins of bilateral lower extremities with other complications: Secondary | ICD-10-CM | POA: Diagnosis not present

## 2023-12-28 DIAGNOSIS — M79604 Pain in right leg: Secondary | ICD-10-CM | POA: Diagnosis not present

## 2023-12-28 DIAGNOSIS — M79605 Pain in left leg: Secondary | ICD-10-CM | POA: Diagnosis not present

## 2024-01-13 DIAGNOSIS — F900 Attention-deficit hyperactivity disorder, predominantly inattentive type: Secondary | ICD-10-CM | POA: Diagnosis not present

## 2024-04-06 ENCOUNTER — Other Ambulatory Visit: Payer: Self-pay | Admitting: Plastic Surgery

## 2024-04-06 DIAGNOSIS — Z1231 Encounter for screening mammogram for malignant neoplasm of breast: Secondary | ICD-10-CM

## 2024-04-11 ENCOUNTER — Other Ambulatory Visit: Payer: Self-pay | Admitting: Medical Genetics

## 2024-04-17 ENCOUNTER — Ambulatory Visit

## 2024-04-20 ENCOUNTER — Ambulatory Visit
Admission: RE | Admit: 2024-04-20 | Discharge: 2024-04-20 | Disposition: A | Discharge status: Home / Self Care | Source: Ambulatory Visit | Attending: Plastic Surgery | Admitting: Plastic Surgery

## 2024-04-20 DIAGNOSIS — Z1231 Encounter for screening mammogram for malignant neoplasm of breast: Secondary | ICD-10-CM

## 2024-05-02 ENCOUNTER — Other Ambulatory Visit (HOSPITAL_COMMUNITY)

## 2024-05-30 ENCOUNTER — Other Ambulatory Visit (HOSPITAL_COMMUNITY)
